# Patient Record
Sex: Female | Born: 1980 | Hispanic: Yes | Smoking: Never smoker
Health system: Southern US, Community
[De-identification: ages and names within clinical notes are randomized; demographics above are authoritative.]

## PROBLEM LIST (undated history)

## (undated) ENCOUNTER — Inpatient Hospital Stay (HOSPITAL_COMMUNITY): Payer: Self-pay

## (undated) DIAGNOSIS — N83201 Unspecified ovarian cyst, right side: Secondary | ICD-10-CM

## (undated) DIAGNOSIS — O139 Gestational [pregnancy-induced] hypertension without significant proteinuria, unspecified trimester: Secondary | ICD-10-CM

## (undated) DIAGNOSIS — N390 Urinary tract infection, site not specified: Secondary | ICD-10-CM

## (undated) DIAGNOSIS — N83202 Unspecified ovarian cyst, left side: Secondary | ICD-10-CM

## (undated) DIAGNOSIS — K259 Gastric ulcer, unspecified as acute or chronic, without hemorrhage or perforation: Secondary | ICD-10-CM

## (undated) HISTORY — DX: Unspecified ovarian cyst, left side: N83.202

## (undated) HISTORY — DX: Urinary tract infection, site not specified: N39.0

## (undated) HISTORY — DX: Unspecified ovarian cyst, right side: N83.201

## (undated) HISTORY — DX: Gastric ulcer, unspecified as acute or chronic, without hemorrhage or perforation: K25.9

## (undated) HISTORY — PX: NO PAST SURGERIES: SHX2092

---
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
## 2018-05-06 NOTE — L&D Delivery Note (Addendum)
Delivery Note At 6:24 PM a healthy female was delivered via Vaginal, Spontaneous Vertex delivery.  APGAR 8/9: weight pending Placenta status: intact, delivered spontaneously   Cord: 3-vessel. No complications  Anesthesia:  Epidural Episiotomy: None Lacerations: None Suture Repair: none needed Est. Blood Loss (mL):  150  Mom to postpartum.  Baby to Couplet care / Skin to Skin.  Nikki Dom Driver 09/05/7480, 7:07 PM  The above was performed under my direct supervision and guidance.

## 2018-07-06 ENCOUNTER — Telehealth: Payer: Self-pay | Admitting: Family Medicine

## 2018-07-06 ENCOUNTER — Other Ambulatory Visit (HOSPITAL_COMMUNITY): Payer: Self-pay | Admitting: Family

## 2018-07-06 DIAGNOSIS — Z369 Encounter for antenatal screening, unspecified: Secondary | ICD-10-CM

## 2018-07-06 LAB — TRICHOMONAS (~~LOC~~): Trichomonas: NEGATIVE

## 2018-07-06 LAB — OB RESULTS CONSOLE HIV ANTIBODY (ROUTINE TESTING): HIV: NONREACTIVE

## 2018-07-06 LAB — OB RESULTS CONSOLE PLATELET COUNT: Platelets: 198

## 2018-07-06 LAB — DRUG SCREEN, URINE: Drug Screen, Urine: NEGATIVE

## 2018-07-06 LAB — OB RESULTS CONSOLE HGB/HCT, BLOOD
HCT: 39 (ref 29–41)
Hemoglobin: 12.9

## 2018-07-06 LAB — OB RESULTS CONSOLE ABO/RH: RH Type: POSITIVE

## 2018-07-06 LAB — CULTURE, OB URINE: Urine Culture, OB: NO GROWTH

## 2018-07-06 LAB — OB RESULTS CONSOLE RPR: RPR: NONREACTIVE

## 2018-07-06 LAB — OB RESULTS CONSOLE RUBELLA ANTIBODY, IGM: Rubella: IMMUNE

## 2018-07-06 LAB — OB RESULTS CONSOLE GC/CHLAMYDIA
CHLAMYDIA, DNA PROBE: NEGATIVE
Gonorrhea: NEGATIVE

## 2018-07-06 LAB — OB RESULTS CONSOLE ANTIBODY SCREEN: ANTIBODY SCREEN: NEGATIVE

## 2018-07-06 LAB — HEPATITIS C ANTIBODY: Hepatitis C Ab: NEGATIVE

## 2018-07-06 LAB — CYTOLOGY - PAP: Pap: NEGATIVE

## 2018-07-06 LAB — OB RESULTS CONSOLE HEPATITIS B SURFACE ANTIGEN: Hepatitis B Surface Ag: NEGATIVE

## 2018-07-06 LAB — OB RESULTS CONSOLE VARICELLA ZOSTER ANTIBODY, IGG: Varicella: IMMUNE

## 2018-07-06 NOTE — Telephone Encounter (Signed)
The health department called to make an appointment for the patient as she is high risk.

## 2018-07-13 ENCOUNTER — Inpatient Hospital Stay (HOSPITAL_COMMUNITY)
Admission: AD | Admit: 2018-07-13 | Discharge: 2018-07-14 | Disposition: A | Discharge status: Home / Self Care | Payer: Medicaid Other | Attending: Obstetrics and Gynecology | Admitting: Obstetrics and Gynecology

## 2018-07-13 DIAGNOSIS — O219 Vomiting of pregnancy, unspecified: Secondary | ICD-10-CM | POA: Insufficient documentation

## 2018-07-13 DIAGNOSIS — R109 Unspecified abdominal pain: Secondary | ICD-10-CM

## 2018-07-13 DIAGNOSIS — O26891 Other specified pregnancy related conditions, first trimester: Secondary | ICD-10-CM

## 2018-07-13 DIAGNOSIS — Z3491 Encounter for supervision of normal pregnancy, unspecified, first trimester: Secondary | ICD-10-CM

## 2018-07-13 DIAGNOSIS — R1010 Upper abdominal pain, unspecified: Secondary | ICD-10-CM | POA: Insufficient documentation

## 2018-07-13 DIAGNOSIS — R12 Heartburn: Secondary | ICD-10-CM | POA: Insufficient documentation

## 2018-07-13 DIAGNOSIS — O99611 Diseases of the digestive system complicating pregnancy, first trimester: Secondary | ICD-10-CM | POA: Insufficient documentation

## 2018-07-13 DIAGNOSIS — Z79899 Other long term (current) drug therapy: Secondary | ICD-10-CM | POA: Insufficient documentation

## 2018-07-13 DIAGNOSIS — R1030 Lower abdominal pain, unspecified: Secondary | ICD-10-CM | POA: Insufficient documentation

## 2018-07-13 DIAGNOSIS — Z3A11 11 weeks gestation of pregnancy: Secondary | ICD-10-CM | POA: Insufficient documentation

## 2018-07-13 HISTORY — DX: Gestational (pregnancy-induced) hypertension without significant proteinuria, unspecified trimester: O13.9

## 2018-07-14 ENCOUNTER — Encounter (HOSPITAL_COMMUNITY): Payer: Self-pay

## 2018-07-14 ENCOUNTER — Inpatient Hospital Stay (HOSPITAL_COMMUNITY): Payer: Self-pay

## 2018-07-14 DIAGNOSIS — Z3A11 11 weeks gestation of pregnancy: Secondary | ICD-10-CM

## 2018-07-14 DIAGNOSIS — O26891 Other specified pregnancy related conditions, first trimester: Secondary | ICD-10-CM | POA: Diagnosis not present

## 2018-07-14 DIAGNOSIS — R109 Unspecified abdominal pain: Secondary | ICD-10-CM

## 2018-07-14 DIAGNOSIS — O219 Vomiting of pregnancy, unspecified: Secondary | ICD-10-CM

## 2018-07-14 LAB — WET PREP, GENITAL
Sperm: NONE SEEN
Trich, Wet Prep: NONE SEEN
Yeast Wet Prep HPF POC: NONE SEEN

## 2018-07-14 LAB — CBC
HCT: 35.3 % — ABNORMAL LOW (ref 36.0–46.0)
Hemoglobin: 11.9 g/dL — ABNORMAL LOW (ref 12.0–15.0)
MCH: 29 pg (ref 26.0–34.0)
MCHC: 33.7 g/dL (ref 30.0–36.0)
MCV: 86.1 fL (ref 80.0–100.0)
Platelets: 187 10*3/uL (ref 150–400)
RBC: 4.1 MIL/uL (ref 3.87–5.11)
RDW: 12 % (ref 11.5–15.5)
WBC: 7.3 10*3/uL (ref 4.0–10.5)
nRBC: 0 % (ref 0.0–0.2)

## 2018-07-14 LAB — GC/CHLAMYDIA PROBE AMP (~~LOC~~) NOT AT ARMC
Chlamydia: NEGATIVE
Neisseria Gonorrhea: NEGATIVE

## 2018-07-14 LAB — ABO/RH: ABO/RH(D): O POS

## 2018-07-14 LAB — HCG, QUANTITATIVE, PREGNANCY: hCG, Beta Chain, Quant, S: 126854 m[IU]/mL — ABNORMAL HIGH (ref <5)

## 2018-07-14 MED ORDER — M.V.I. ADULT IV INJ
Freq: Once | INTRAVENOUS | Status: AC
  Administered 2018-07-14: 02:00:00 via INTRAVENOUS
  Filled 2018-07-14: qty 10

## 2018-07-14 MED ORDER — SODIUM CHLORIDE 0.9 % IV SOLN
8.0000 mg | Freq: Once | INTRAVENOUS | Status: AC
  Administered 2018-07-14: 8 mg via INTRAVENOUS
  Filled 2018-07-14: qty 4

## 2018-07-14 MED ORDER — FAMOTIDINE 20 MG PO TABS: 20.0000 mg | ORAL_TABLET | Freq: Two times a day (BID) | ORAL | 0 refills | Status: DC

## 2018-07-14 MED ORDER — ONDANSETRON 4 MG PO TBDP: 4.0000 mg | ORAL_TABLET | Freq: Three times a day (TID) | ORAL | 0 refills | Status: DC | PRN

## 2018-07-14 MED ORDER — LACTATED RINGERS IV BOLUS
1000.0000 mL | Freq: Once | INTRAVENOUS | Status: AC
  Administered 2018-07-14: 1000 mL via INTRAVENOUS

## 2018-07-14 MED ORDER — FAMOTIDINE IN NACL 20-0.9 MG/50ML-% IV SOLN
20.0000 mg | Freq: Once | INTRAVENOUS | Status: AC
  Administered 2018-07-14: 20 mg via INTRAVENOUS
  Filled 2018-07-14: qty 50

## 2018-07-14 NOTE — MAU Provider Note (Signed)
History     CSN: [id number]  Arrival date and time: 07/13/18 2342   First Provider Initiated Contact with Patient 07/13/18 1155      Chief Complaint  Patient presents with  . Abdominal Pain  . Emesis   Marie Bridges is a 38 y.o. G4P3 at [redacted]w[redacted]d who presents to MAU with complaints of abdominal pain and N/V. She reports N/V has been occurring for the past 3 days, reports being unable to keep anything down and vomiting 13+ times over the past 24 hours. She reports being unable to eat over the last 3 days. Reports abdominal pain started occurring 2 days ago. Reports upper and lower abdominal pain, describes upper abdominal pain as aching due to not being able to eat, lower abdominal pain as intermittent cramping with sharp pain. Rates pain 5/10- has not taken any medication for abdominal pain. Denies vaginal bleeding or vaginal discharge.   OB History    Gravida  4   Para  3   Term  3   Preterm  0   AB  0   Living  3     SAB  0   TAB  0   Ectopic  0   Multiple  0   Live Births  3           Past Medical History:  Diagnosis Date  . Pregnancy induced hypertension    First pregnancy  . Preterm labor     Past Surgical History:  Procedure Laterality Date  . NO PAST SURGERIES      No family history on file.  Social History   Tobacco Use  . Smoking status: Not on file  Substance Use Topics  . Alcohol use: Not on file  . Drug use: Not on file    Allergies: No Known Allergies  Medications Prior to Admission  Medication Sig Dispense Refill Last Dose  . metroNIDAZOLE (FLAGYL) 500 MG tablet Take 500 mg by mouth 3 (three) times daily.       Review of Systems  Constitutional: Negative.   Respiratory: Negative.   Cardiovascular: Negative.   Gastrointestinal: Positive for abdominal pain, nausea and vomiting. Negative for constipation and diarrhea.  Genitourinary: Negative.   Musculoskeletal: Negative.    Physical Exam   Blood pressure 116/65, pulse  95, temperature 98.9 F (37.2 C), resp. rate 16, weight 55.6 kg, last menstrual period 04/24/2018, SpO2 100 %.  Physical Exam  Nursing note and vitals reviewed. Constitutional: She is oriented to person, place, and time. She appears well-developed and well-nourished. No distress.  Cardiovascular: Normal rate, regular rhythm and normal heart sounds.  Respiratory: Effort normal and breath sounds normal. No respiratory distress. She has no wheezes. She has no rales.  GI: Soft. There is abdominal tenderness. There is no rebound and no guarding.  Tenderness in upper and lower abdomen  Neurological: She is alert and oriented to person, place, and time.  Psychiatric: She has a normal mood and affect. Her behavior is normal. Thought content normal.   FHR 165 by doppler   MAU Course  Procedures  MDM Orders Placed This Encounter  Procedures  . Wet prep, genital  . US OB Comp Less 14 Wks  . Urinalysis, Routine w reflex microscopic  . hCG, quantitative, pregnancy  . CBC  . ABO/Rh  . Insert peripheral IV   Labs and [place] report reviewed:  Results for orders placed or performed during the hospital encounter of 07/13/18 (from the past 24 hour(s))  hCG,  quantitative, pregnancy     Status: Abnormal   Collection Time: 07/14/18 12:30 AM  Result Value Ref Range   hCG, Beta Nyra Jabs, S [phone] (H) <5 mIU/mL  CBC     Status: Abnormal   Collection Time: 07/14/18 12:42 AM  Result Value Ref Range   WBC 7.3 4.0 - 10.5 K/uL   RBC 4.10 3.87 - 5.11 MIL/uL   Hemoglobin 11.9 (L) 12.0 - 15.0 g/dL   HCT 04.5 (L) 40.9 - 81.1 %   MCV 86.1 80.0 - 100.0 fL   MCH 29.0 26.0 - 34.0 pg   MCHC 33.7 30.0 - 36.0 g/dL   RDW 91.4 78.2 - 95.6 %   Platelets 187 150 - 400 K/uL   nRBC 0.0 0.0 - 0.2 %  Wet prep, genital     Status: Abnormal   Collection Time: 07/14/18 12:51 AM  Result Value Ref Range   Yeast Wet Prep HPF POC NONE SEEN NONE SEEN   Trich, Wet Prep NONE SEEN NONE SEEN   Clue Cells Wet Prep HPF POC  PRESENT (A) NONE SEEN   WBC, Wet Prep HPF POC MANY (A) NONE SEEN   Sperm NONE SEEN   ABO/Rh     Status: None   Collection Time: 07/14/18  1:03 AM  Result Value Ref Range   ABO/RH(D) O POS    No rh immune globuloin      NOT A RH IMMUNE GLOBULIN CANDIDATE, PT RH POSITIVE Performed at [facility], [address] [place], [place] [address]    US Ob Comp Less 14 Wks  Result Date: 07/14/2018 CLINICAL DATA:  Abdominal pain EXAM: OBSTETRIC <14 WK ULTRASOUND TECHNIQUE: Transabdominal ultrasound was performed for evaluation of the gestation as well as the maternal uterus and adnexal regions. COMPARISON:  None. FINDINGS: Intrauterine gestational sac: Single Yolk sac:  Not seen Embryo:  Visualized. Cardiac Activity: Visualized. Heart Rate: 153 bpm CRL: 55.7 mm   12 w 1 d                  [place] EDC: 01/25/2019 Subchorionic hemorrhage:  None visualized. Maternal uterus/adnexae: right ovary measures 2.5 x 1.4 x 1.9 cm. Left ovary measures 2.8 x 2 x 2.1 cm. No significant free fluid. IMPRESSION: Single viable intrauterine pregnancy as above. No specific abnormality is seen Electronically Signed   By: Jasmine Pang M.D.   On: 07/14/2018 02:42   Treatments in MAU included LR bolus, IV pepcid, zofran and banana bag for N/V. Patient able to tolerate PO challenge prior to discharge home, reports pain is resolved and heartburn is relieved. Request medication for heartburn/acid reflux for home use.   Follow up as scheduled for prenatal appointments, return to MAU as needed. Rx for zofran and pepcid sent to pharmacy of choice. Pt stable at time of discharge.   Assessment and Plan   1. Normal IUP (intrauterine pregnancy) on prenatal ultrasound, first trimester   2. Abdominal pain during pregnancy in first trimester   3. Nausea and vomiting during pregnancy   4. [redacted] weeks gestation of pregnancy    Discharge home Follow up as scheduled  Return to MAU as needed Rx for zofran and pepcid for home use    Follow-up Information    [facility].   Specialty:  Obstetrics and Gynecology Why:  Follow up as scheduled for prenatal appointment  Contact information: [address] [place] [address] [phone]  Allergies as of 07/14/2018   No Known Allergies     Medication List    STOP taking these medications   metroNIDAZOLE 500 MG tablet Commonly known as:  FLAGYL     TAKE these medications   famotidine 20 MG tablet Commonly known as:  PEPCID Take 1 tablet (20 mg total) by mouth 2 (two) times daily.   ondansetron 4 MG disintegrating tablet Commonly known as:  Zofran ODT Take 1 tablet (4 mg total) by mouth every 8 (eight) hours as needed for nausea or vomiting.      Sharyon Cable CNM 07/14/2018, 2:56 AM

## 2018-07-14 NOTE — MAU Note (Signed)
Pt here with complaints of nausea, HA, chills x3 days. Pt reports vomiting up to 13 times/day. Reports some small amount of blood in her vomit. Has not eaten in 3 days. Reports she is not able to keep anything down. Reports upper and lower abdominal pain. Denies vaginal bleeding. Reports she is 11 weeks.

## 2018-07-16 ENCOUNTER — Encounter: Payer: Self-pay | Admitting: *Deleted

## 2018-07-20 ENCOUNTER — Encounter: Payer: Self-pay | Admitting: Obstetrics and Gynecology

## 2018-07-20 ENCOUNTER — Encounter (HOSPITAL_COMMUNITY): Payer: Self-pay

## 2018-07-20 DIAGNOSIS — O09529 Supervision of elderly multigravida, unspecified trimester: Secondary | ICD-10-CM | POA: Insufficient documentation

## 2018-07-24 ENCOUNTER — Encounter: Payer: Self-pay | Admitting: *Deleted

## 2018-07-24 ENCOUNTER — Encounter (HOSPITAL_COMMUNITY): Payer: Self-pay | Admitting: *Deleted

## 2018-07-27 ENCOUNTER — Other Ambulatory Visit (HOSPITAL_COMMUNITY): Payer: Self-pay

## 2018-07-27 ENCOUNTER — Encounter (HOSPITAL_COMMUNITY): Payer: Self-pay

## 2018-07-27 ENCOUNTER — Ambulatory Visit (HOSPITAL_COMMUNITY): Payer: Self-pay

## 2018-07-29 ENCOUNTER — Encounter (HOSPITAL_COMMUNITY): Payer: Self-pay

## 2018-08-05 ENCOUNTER — Ambulatory Visit (INDEPENDENT_AMBULATORY_CARE_PROVIDER_SITE_OTHER): Payer: Self-pay | Admitting: Obstetrics & Gynecology

## 2018-08-05 ENCOUNTER — Encounter: Payer: Self-pay | Admitting: Obstetrics & Gynecology

## 2018-08-05 ENCOUNTER — Encounter: Payer: Self-pay | Admitting: *Deleted

## 2018-08-05 ENCOUNTER — Other Ambulatory Visit: Payer: Self-pay

## 2018-08-05 VITALS — BP 117/68 | HR 87 | Wt 124.5 lb

## 2018-08-05 DIAGNOSIS — O26892 Other specified pregnancy related conditions, second trimester: Secondary | ICD-10-CM

## 2018-08-05 DIAGNOSIS — O09522 Supervision of elderly multigravida, second trimester: Secondary | ICD-10-CM

## 2018-08-05 DIAGNOSIS — O09292 Supervision of pregnancy with other poor reproductive or obstetric history, second trimester: Secondary | ICD-10-CM

## 2018-08-05 DIAGNOSIS — Z3480 Encounter for supervision of other normal pregnancy, unspecified trimester: Secondary | ICD-10-CM | POA: Insufficient documentation

## 2018-08-05 DIAGNOSIS — R300 Dysuria: Secondary | ICD-10-CM

## 2018-08-05 DIAGNOSIS — Z789 Other specified health status: Secondary | ICD-10-CM | POA: Insufficient documentation

## 2018-08-05 DIAGNOSIS — Z3A14 14 weeks gestation of pregnancy: Secondary | ICD-10-CM

## 2018-08-05 LAB — POCT URINALYSIS DIP (DEVICE)
Bilirubin Urine: NEGATIVE
Glucose, UA: NEGATIVE mg/dL
Hgb urine dipstick: NEGATIVE
Ketones, ur: NEGATIVE mg/dL
Nitrite: NEGATIVE
Protein, ur: NEGATIVE mg/dL
Specific Gravity, Urine: 1.025 (ref 1.005–1.030)
Urobilinogen, UA: 1 mg/dL (ref 0.0–1.0)
pH: 7 (ref 5.0–8.0)

## 2018-08-05 MED ORDER — OMEPRAZOLE 20 MG PO CPDR: 20.0000 mg | DELAYED_RELEASE_CAPSULE | Freq: Every day | ORAL | 9 refills | Status: DC

## 2018-08-05 MED ORDER — ASPIRIN 81 MG PO CHEW: 81.0000 mg | CHEWABLE_TABLET | Freq: Every day | ORAL | 9 refills | Status: DC

## 2018-08-05 NOTE — Progress Notes (Signed)
  Subjective:    Marie Bridges is being seen today for her first obstetrical visit.  This is a planned pregnancy. She is at [redacted]w[redacted]d gestation. Her obstetrical history is significant for advanced maternal age and pre-eclampsia. Relationship with FOB: spouse, living together. Patient does intend to breast feed. Pregnancy history fully reviewed.  Patient reports no complaints.  Review of Systems:   Review of Systems  Objective:     BP 117/68   Pulse 87   Wt 124 lb 8 oz (56.5 kg)   LMP 04/24/2018  Physical Exam  Exam Breathing, conversing, and ambulating normally Well nourished, well hydrated Latina, no apparent distress  Live interpretor present  Heart- rrr Lungs- CTAB Abd- benign FHR- 154  Assessment:    Pregnancy: [id number] Patient Active Problem List   Diagnosis Date Noted  . Supervision of other normal pregnancy, antepartum 08/05/2018  . AMA (advanced maternal age) multigravida 35+ 07/20/2018       Plan:     Initial labs drawn. Prenatal vitamins. Problem list reviewed and updated. AMA- she has a First screen scheduled but she does not want this. She wants NIPS today. Role of ultrasound in pregnancy discussed; fetal survey: ordered. Rec baby asa daily due to h/o pre eclampsia in her first pregnancy Check pr/creatinine and complete meta panel Panorma/Horizen today She was given a BP cuff but does not have an email or smart phone. Virtual visit at 20 weeks and in person at 28 weeks.  Allie Bossier 08/05/2018

## 2018-08-05 NOTE — Progress Notes (Signed)
Patient given BP cuff ([facility] issued) and instructed in use/how to check her BP. Patient is unable to use BabyScripts App.

## 2018-08-06 LAB — BASIC METABOLIC PANEL
BUN/Creatinine Ratio: 16 (ref 9–23)
BUN: 7 mg/dL (ref 6–20)
CO2: 21 mmol/L (ref 20–29)
Calcium: 9 mg/dL (ref 8.7–10.2)
Chloride: 103 mmol/L (ref 96–106)
Creatinine, Ser: 0.43 mg/dL — ABNORMAL LOW (ref 0.57–1.00)
GFR calc Af Amer: 152 mL/min/{1.73_m2} (ref >59)
GFR calc non Af Amer: 132 mL/min/{1.73_m2} (ref >59)
Glucose: 72 mg/dL (ref 65–99)
Potassium: 4.2 mmol/L (ref 3.5–5.2)
Sodium: 139 mmol/L (ref 134–144)

## 2018-08-06 LAB — PROTEIN / CREATININE RATIO, URINE
Creatinine, Urine: 131 mg/dL
Protein, Ur: 43 mg/dL
Protein/Creat Ratio: 328 mg/g creat — ABNORMAL HIGH (ref 0–200)

## 2018-08-06 LAB — HEMOGLOBIN A1C
Est. average glucose Bld gHb Est-mCnc: 100 mg/dL
Hgb A1c MFr Bld: 5.1 % (ref 4.8–5.6)

## 2018-08-07 LAB — CULTURE, OB URINE

## 2018-08-07 LAB — URINE CULTURE, OB REFLEX

## 2018-08-12 ENCOUNTER — Encounter (HOSPITAL_COMMUNITY): Payer: Self-pay

## 2018-08-24 ENCOUNTER — Telehealth: Payer: Self-pay | Admitting: Family Medicine

## 2018-08-24 NOTE — Telephone Encounter (Signed)
Called patient with Spanish interpret Mariel to inform her of her follow-up appointment which will be done over the telephone. Patient verbalized understanding.

## 2018-09-17 ENCOUNTER — Ambulatory Visit (INDEPENDENT_AMBULATORY_CARE_PROVIDER_SITE_OTHER): Payer: Self-pay | Admitting: Obstetrics & Gynecology

## 2018-09-17 ENCOUNTER — Other Ambulatory Visit: Payer: Self-pay

## 2018-09-17 VITALS — BP 138/90

## 2018-09-17 DIAGNOSIS — R3915 Urgency of urination: Secondary | ICD-10-CM

## 2018-09-17 DIAGNOSIS — O09522 Supervision of elderly multigravida, second trimester: Secondary | ICD-10-CM

## 2018-09-17 DIAGNOSIS — Z789 Other specified health status: Secondary | ICD-10-CM

## 2018-09-17 DIAGNOSIS — O09292 Supervision of pregnancy with other poor reproductive or obstetric history, second trimester: Secondary | ICD-10-CM

## 2018-09-17 DIAGNOSIS — Z3480 Encounter for supervision of other normal pregnancy, unspecified trimester: Secondary | ICD-10-CM

## 2018-09-17 DIAGNOSIS — Z3A20 20 weeks gestation of pregnancy: Secondary | ICD-10-CM

## 2018-09-17 NOTE — Progress Notes (Signed)
I connected with  Samiksha Belteton-Luis on 09/17/18 at  8:15 AM EDT by telephone and verified that I am speaking with the correct person using two identifiers.   I discussed the limitations, risks, security and privacy concerns of performing an evaluation and management service by telephone and the availability of in person appointments. I also discussed with the patient that there may be a patient responsible charge related to this service. The patient expressed understanding and agreed to proceed.  Janene Madeira Azarion Hove, CMA 09/17/2018  8:15 AM        Interpreter ID #[id number]  Pt states that she has been having contractions and her stomach has been feeling hard. This has been about two weeks now and her Curlene Labrum is extremely hard. Pt states that she needs to be seen because she is cramping really badly and she also stated that she has not has a ultrasound and never received a call about an ultrasound. Patient doesn't understand how to navigate or use the web ex app on her phone, states someone in the office downloaded it for her but is confused on how to navigate or use it. Had patient take her blood pressure over the phone she states that it is 138/90.  U/S Scheduled for Tuesday 5/19 @1245pm 

## 2018-09-17 NOTE — Progress Notes (Signed)
   TELEHEALTH VIRTUAL OBSTETRICS PRENATAL VISIT ENCOUNTER NOTE  I connected with Marie Bridges on 09/17/18 at  8:15 AM EDT by telephone at home and verified that I am speaking with the correct person using two identifiers.   I discussed the limitations, risks, security and privacy concerns of performing an evaluation and management service by telephone and the availability of in person appointments. I also discussed with the patient that there may be a patient responsible charge related to this service. The patient expressed understanding and agreed to proceed. Subjective:  Marie Bridges is a 38 y.o. [phone] at [redacted]w[redacted]d being seen today for ongoing prenatal care.  She is currently monitored for the following issues for this high-risk pregnancy and has AMA (advanced maternal age) multigravida 35+; Supervision of other normal pregnancy, antepartum; and Language barrier on their problem list.  Patient reports cramping for 2 weeks, no bleeding. She also urinary urgency for about 1 week..  Reports fetal movement.  .  .   . Denies any contractions, bleeding or leaking of fluid.   The following portions of the patient's history were reviewed and updated as appropriate: allergies, current medications, past family history, past medical history, past social history, past surgical history and problem list.   Objective:  There were no vitals filed for this visit.  Fetal Status:           General:  Alert, oriented and cooperative. Patient is in no acute distress.  Respiratory: Normal respiratory effort, no problems with respiration noted  Mental Status: Normal mood and affect. Normal behavior. Normal judgment and thought content.  Rest of physical exam deferred due to type of encounter  Assessment and Plan:  Pregnancy: [id number] at [redacted]w[redacted]d 1. Language barrier - [place] interpretor used for visit  2. Multigravida of advanced maternal age in second trimester - anatomy u/s scheduled. It had been  ordered at her first visit but never scheduled.   3. Supervision of other normal pregnancy, antepartum   4. History of pre-eclampsia in prior pregnancy, currently pregnant in second trimester - baby asa daily  Preterm labor symptoms and general obstetric precautions including but not limited to vaginal bleeding, contractions, leaking of fluid and fetal movement were reviewed in detail with the patient. I discussed the assessment and treatment plan with the patient. The patient was provided an opportunity to ask questions and all were answered. The patient agreed with the plan and demonstrated an understanding of the instructions. The patient was advised to call back or seek an in-person office evaluation/go to MAU at [facility] for any urgent or concerning symptoms. Please refer to After Visit Summary for other counseling recommendations.   I provided 10 minutes of face-to-face via WebEx time during this encounter.  No follow-ups on file.  No future appointments.  Allie Bossier, MD [facility] [employer], [facility]

## 2018-09-18 ENCOUNTER — Ambulatory Visit (INDEPENDENT_AMBULATORY_CARE_PROVIDER_SITE_OTHER): Payer: Self-pay

## 2018-09-18 ENCOUNTER — Other Ambulatory Visit: Payer: Self-pay

## 2018-09-18 VITALS — BP 99/66 | HR 89 | Wt 126.4 lb

## 2018-09-18 DIAGNOSIS — Z0130 Encounter for examination of blood pressure without abnormal findings: Secondary | ICD-10-CM

## 2018-09-18 NOTE — Progress Notes (Signed)
Agree with A & P. 

## 2018-09-18 NOTE — Progress Notes (Signed)
Pt here today for BP check, weight check, and urine culture.  Pt BP 99/66 LA, weight is 126.4lb , and OB urine culture obtained today. With [employer]. Pt denies any new sx's today.  Notified Dr. Marice Potter pt's BP and weight.  No new orders.  Pt reports that she went walking and then tried to come home and cook and she was so fatigued.  I explained to the pt that it is normal in pregnancy it is her body telling her to rest. Pt stated that when she did rest the discomfort went away.  Pt did not have any further questions.

## 2018-09-21 LAB — URINE CULTURE, OB REFLEX

## 2018-09-21 LAB — CULTURE, OB URINE

## 2018-09-22 ENCOUNTER — Other Ambulatory Visit: Payer: Self-pay | Admitting: Obstetrics & Gynecology

## 2018-09-22 ENCOUNTER — Other Ambulatory Visit: Payer: Self-pay

## 2018-09-22 ENCOUNTER — Ambulatory Visit (HOSPITAL_COMMUNITY)
Admission: RE | Admit: 2018-09-22 | Discharge: 2018-09-22 | Disposition: A | Discharge status: Home / Self Care | Payer: Self-pay | Source: Ambulatory Visit | Attending: Obstetrics and Gynecology | Admitting: Obstetrics and Gynecology

## 2018-09-22 ENCOUNTER — Other Ambulatory Visit (HOSPITAL_COMMUNITY): Payer: Self-pay | Admitting: *Deleted

## 2018-09-22 DIAGNOSIS — O09292 Supervision of pregnancy with other poor reproductive or obstetric history, second trimester: Secondary | ICD-10-CM

## 2018-09-22 DIAGNOSIS — O09212 Supervision of pregnancy with history of pre-term labor, second trimester: Secondary | ICD-10-CM

## 2018-09-22 DIAGNOSIS — Z363 Encounter for antenatal screening for malformations: Secondary | ICD-10-CM

## 2018-09-22 DIAGNOSIS — Z3A21 21 weeks gestation of pregnancy: Secondary | ICD-10-CM

## 2018-09-22 DIAGNOSIS — O09522 Supervision of elderly multigravida, second trimester: Secondary | ICD-10-CM

## 2018-09-22 DIAGNOSIS — Z3480 Encounter for supervision of other normal pregnancy, unspecified trimester: Secondary | ICD-10-CM

## 2018-09-22 DIAGNOSIS — O09529 Supervision of elderly multigravida, unspecified trimester: Secondary | ICD-10-CM

## 2018-10-13 ENCOUNTER — Telehealth: Payer: Self-pay | Admitting: Family Medicine

## 2018-10-13 NOTE — Telephone Encounter (Signed)
Patient called and informed about appt with [name].

## 2018-10-14 ENCOUNTER — Telehealth: Payer: Self-pay | Admitting: Obstetrics & Gynecology

## 2018-10-14 NOTE — Telephone Encounter (Signed)
Called patient with [name]’s help. She already has WebEx app. °

## 2018-10-15 ENCOUNTER — Ambulatory Visit (INDEPENDENT_AMBULATORY_CARE_PROVIDER_SITE_OTHER): Payer: Self-pay | Admitting: Obstetrics & Gynecology

## 2018-10-15 ENCOUNTER — Other Ambulatory Visit: Payer: Self-pay

## 2018-10-15 VITALS — BP 90/67 | HR 85

## 2018-10-15 DIAGNOSIS — Z789 Other specified health status: Secondary | ICD-10-CM

## 2018-10-15 DIAGNOSIS — O09522 Supervision of elderly multigravida, second trimester: Secondary | ICD-10-CM

## 2018-10-15 DIAGNOSIS — Z3A24 24 weeks gestation of pregnancy: Secondary | ICD-10-CM

## 2018-10-15 DIAGNOSIS — Z3480 Encounter for supervision of other normal pregnancy, unspecified trimester: Secondary | ICD-10-CM

## 2018-10-15 NOTE — Progress Notes (Signed)
I connected with  Marie Bridges on 10/15/18 at  9:35 AM EDT by telephone and verified that I am speaking with the correct person using two identifiers.   I discussed the limitations, risks, security and privacy concerns of performing an evaluation and management service by telephone and the availability of in person appointments. I also discussed with the patient that there may be a patient responsible charge related to this service. The patient expressed understanding and agreed to proceed.  [name], RN 10/15/2018  9:28 AM   Pt has BP cuff but no email and therefore is unable to do babyscripts. Pt instructed to check a weekly blood pressure, write them down, and call the clinic if any are more than 140/90.  Pt verbalized understanding. Raquel used for interpretation.

## 2018-10-15 NOTE — Progress Notes (Signed)
TELEHEALTH OBSTETRICS PRENATAL VIRTUAL VIDEO VISIT ENCOUNTER NOTE  [facility]   I connected with Marie Bridges on 10/15/18 at  9:35 AM EDT by WebEx Encounter at home and verified that I am speaking with the correct person using two identifiers.   I discussed the limitations, risks, security and privacy concerns of performing an evaluation and management service by telephone and the availability of in person appointments. I also discussed with the patient that there may be a patient responsible charge related to this service. The patient expressed understanding and agreed to proceed. Subjective:  Marie Bridges is a 38 y.o. [phone] (at 5385w6d being seen today for ongoing prenatal care.  She is currently monitored for the following issues for this high-risk pregnancy and has AMA (advanced maternal age) multigravida 35+; Supervision of other normal pregnancy, antepartum; and Language barrier on their problem list.  Patient reports no complaints.  Contractions: Irregular. Vag. Bleeding: None.  Movement: Present. Denies any leaking of fluid.   The following portions of the patient's history were reviewed and updated as appropriate: allergies, current medications, past family history, past medical history, past social history, past surgical history and problem list.   Objective:   Vitals:   10/15/18 0933  BP: 90/67  Pulse: 85    Fetal Status:     Movement: Present     General:  Alert, oriented and cooperative. Patient is in no acute distress.  Respiratory: Normal respiratory effort, no problems with respiration noted  Mental Status: Normal mood and affect. Normal behavior. Normal judgment and thought content.  Rest of physical exam deferred due to type of encounter  Imaging: Koreas Mfm Ob Detail +14 Wk  Result Date: 09/22/2018 ----------------------------------------------------------------------  OBSTETRICS REPORT                        (Signed Final 09/22/2018 02:22 pm) ---------------------------------------------------------------------- Patient Info  ID #:       [id number]                          D.O.B.:  01-11-1981 (37 yrs)  Name:       Marie Bridges              Visit Date: 09/22/2018 12:40 pm ---------------------------------------------------------------------- Performed By  Performed By:     Hurman HornAlyssa Keatts          Ref. Address:     [address]                    [address]                                                             [address]                                                             [place], [place]  [id number]  Attending:        Noralee Spaceavi Shankar MD        [facility]  Referred By:      Allie BossierMYRA C Paulita Licklider MD ---------------------------------------------------------------------- Orders   #  Description                          Code         Ordered By   1  US MFM OB DETAIL +14 WK              76811.01     Calianna Kim  ----------------------------------------------------------------------   #  Order #                    Accession #                 Episode #   1  [id number]                  [id number]                  [id number]  ---------------------------------------------------------------------- Indications   Encounter for antenatal screening for          Z36.3   malformations   Advanced maternal age multigravida 4635+,        70O09.522   second trimester (neg horizon)   Poor obstetric history: Previous               O09.299   preeclampsia / eclampsia/gestational HTN   (ASA)   Poor obstetric history: Previous preterm       O09.219   delivery, antepartum (34-due to pre-e)   [redacted] weeks gestation of pregnancy                Z3A.21  ---------------------------------------------------------------------- Fetal Evaluation  Num Of Fetuses:         1  Fetal Heart Rate(bpm):  145  Cardiac Activity:        Observed  Presentation:           Variable  Placenta:               Posterior  P. Cord Insertion:      Visualized  Amniotic Fluid  AFI FV:      Within normal limits                              Largest Pocket(cm)                              4.5 ---------------------------------------------------------------------- Biometry  BPD:      53.8  mm     G. Age:  22w 2d         78  %    CI:        74.46   %    70 - 86  FL/HC:      17.0   %    18.4 - 20.2  HC:      197.9  mm     G. Age:  22w 0d         57  %    HC/AC:      1.14        1.06 - 1.25  AC:      174.2  mm     G. Age:  22w 2d         68  %    FL/BPD:     62.6   %    71 - 87  FL:       33.7  mm     G. Age:  20w 4d         13  %    FL/AC:      19.3   %    20 - 24  HUM:      31.2  mm     G. Age:  20w 3d         17  %  CER:        23  mm     G. Age:  21w 4d         47  %  NFT:       6.9  mm  LV:        3.9  mm  CM:        3.4  mm  Est. FW:     438  gm    0 lb 15 oz      46  % ---------------------------------------------------------------------- OB History  Gravidity:    4         Term:   2        Prem:   1 ---------------------------------------------------------------------- Gestational Age  LMP:           21w 4d        Date:  04/24/18                 EDD:   01/29/19  U/S Today:     21w 6d                                        EDD:   01/27/19  Best:          [place] 4d     Det. By:  LMP  (04/24/18)          EDD:   01/29/19 ---------------------------------------------------------------------- Anatomy  Cranium:               Appears normal         Aortic Arch:            Appears normal  Cavum:                 Appears normal         Ductal Arch:            Appears normal  Ventricles:            Appears normal         Diaphragm:              Appears normal  Choroid Plexus:        Appears normal         Stomach:  Appears normal, left                                                                         sided  Cerebellum:            Appears normal         Abdomen:                Appears normal  Posterior Fossa:       Appears normal         Abdominal Wall:         Appears nml (cord                                                                        insert, abd wall)  Nuchal Fold:           Not applicable (>86    Cord Vessels:           Appears normal ([redacted]                         wks GA)                                        vessel cord)  Face:                  Appears normal         Kidneys:                Appear normal                         (orbits and profile)  Lips:                  Appears normal         Bladder:                Appears normal  Thoracic:              Appears normal         Spine:                  Appears normal  Heart:                 Appears normal         Upper Extremities:      Appears normal                         (4CH, axis, and                         situs)  RVOT:                  Appears normal  Lower Extremities:      Appears normal  LVOT:                  Appears normal  Other:  Fetus appears to be a female. Heels and 5th digit visualized. Open          hands visualized. Nasal bone visualized. Feet visualized. ---------------------------------------------------------------------- Cervix Uterus Adnexa  Cervix  Length:           4.28  cm.  Normal appearance by transabdominal scan.  Uterus  No abnormality visualized.  Left Ovary  Not visualized.  Right Ovary  Not visualized.  Adnexa  No abnormality visualized. ---------------------------------------------------------------------- Impression  We performed fetal anatomy scan. No makers of  aneuploidies or fetal structural defects are seen. Fetal  biometry is consistent with her previously-established dates.  Amniotic fluid is normal and good fetal activity is seen.  Cell-free fetal DNA screening results are pending. ---------------------------------------------------------------------- Recommendations  An appointmen was made for  her to return at 32 weeks'  gestation for fetal growth assessment (AMA). ----------------------------------------------------------------------                  Noralee Spaceavi Shankar, MD Electronically Signed Final Report   09/22/2018 02:22 pm ----------------------------------------------------------------------   Assessment and Plan:  Pregnancy: [id number] at 3045w6d 1. Multigravida of advanced maternal age in second trimester - We discussed NIPS which didn't appear to have been drawn. She declines this test.  2. Supervision of other normal pregnancy, antepartum - 2 hour GTT at next visit - follow up MFM u/s scheduled  3. Language barrier -Live interpretor present   Preterm labor symptoms and general obstetric precautions including but not limited to vaginal bleeding, contractions, leaking of fluid and fetal movement were reviewed in detail with the patient. I discussed the assessment and treatment plan with the patient. The patient was provided an opportunity to ask questions and all were answered. The patient agreed with the plan and demonstrated an understanding of the instructions. The patient was advised to call back or seek an in-person office evaluation/[facility] for any urgent or concerning symptoms. Please refer to After Visit Summary for other counseling recommendations.   I provided 10 minutes of face-to-face time during this encounter.  No follow-ups on file.  Future Appointments  Date Time Provider Department Center  11/24/2018  1:15 PM [facility]-M[facility] US 4 [facility]-MFCUS MFC-US    Allie BossierMyra C Joven Mom, MD [facility], [facility]

## 2018-11-09 ENCOUNTER — Other Ambulatory Visit: Payer: Self-pay | Admitting: *Deleted

## 2018-11-09 DIAGNOSIS — Z3480 Encounter for supervision of other normal pregnancy, unspecified trimester: Secondary | ICD-10-CM

## 2018-11-12 ENCOUNTER — Telehealth: Payer: Self-pay | Admitting: Obstetrics and Gynecology

## 2018-11-12 NOTE — Telephone Encounter (Signed)
[name] spoke with patient about her appointment on 7/10 @ 8:55. Per [name] patient was instructed to wear a face mask for the entire appointment and no visitors are allowed during the appointment. Patient was screened for covid symptoms and denied having any.

## 2018-11-13 ENCOUNTER — Ambulatory Visit (INDEPENDENT_AMBULATORY_CARE_PROVIDER_SITE_OTHER): Payer: Self-pay | Admitting: Obstetrics and Gynecology

## 2018-11-13 ENCOUNTER — Other Ambulatory Visit: Payer: Self-pay

## 2018-11-13 VITALS — BP 100/66 | HR 85 | Wt 128.0 lb

## 2018-11-13 DIAGNOSIS — R300 Dysuria: Secondary | ICD-10-CM

## 2018-11-13 DIAGNOSIS — Z3A29 29 weeks gestation of pregnancy: Secondary | ICD-10-CM

## 2018-11-13 DIAGNOSIS — O26893 Other specified pregnancy related conditions, third trimester: Secondary | ICD-10-CM

## 2018-11-13 DIAGNOSIS — Z3480 Encounter for supervision of other normal pregnancy, unspecified trimester: Secondary | ICD-10-CM

## 2018-11-13 DIAGNOSIS — Z789 Other specified health status: Secondary | ICD-10-CM

## 2018-11-13 DIAGNOSIS — O09523 Supervision of elderly multigravida, third trimester: Secondary | ICD-10-CM

## 2018-11-13 LAB — POCT URINALYSIS DIP (DEVICE)
Bilirubin Urine: NEGATIVE
Glucose, UA: NEGATIVE mg/dL
Ketones, ur: NEGATIVE mg/dL
Leukocytes,Ua: NEGATIVE
Nitrite: NEGATIVE
Protein, ur: NEGATIVE mg/dL
Specific Gravity, Urine: 1.025 (ref 1.005–1.030)
Urobilinogen, UA: 0.2 mg/dL (ref 0.0–1.0)
pH: 7 (ref 5.0–8.0)

## 2018-11-13 NOTE — Progress Notes (Signed)
Prenatal Visit Note Date: 11/13/2018 [facility]  Subjective:  Marie Bridges is a 38 y.o. [phone] at [redacted]w[redacted]d being seen today for ongoing prenatal care.  She is currently monitored for the following issues for this high-risk pregnancy and has AMA (advanced maternal age) multigravida 58+; Supervision of other normal pregnancy, antepartum; and Language barrier on their problem list.  Patient reports dysuria at the end of voiding. no smell to urine. no contractions today but some in the past. .   Contractions: Not present. Vag. Bleeding: None.  Movement: Present. Denies leaking of fluid.   The following portions of the patient's history were reviewed and updated as appropriate: allergies, current medications, past family history, past medical history, past social history, past surgical history and problem list. Problem list updated.  Objective:   Vitals:   11/13/18 0909  BP: 100/66  Pulse: 85  Weight: 128 lb (58.1 kg)    Fetal Status: Fetal Heart Rate (bpm): 154 Fundal Height: 30 cm Movement: Present     General:  Alert, oriented and cooperative. Patient is in no acute distress.  Skin: Skin is warm and dry. No rash noted.   Cardiovascular: Normal heart rate noted  Respiratory: Normal respiratory effort, no problems with respiration noted  Abdomen: Soft, gravid, appropriate for gestational age. Pain/Pressure: Present     Pelvic:  Cervical exam deferred        Extremities: Normal range of motion.  Edema: None  Mental Status: Normal mood and affect. Normal behavior. Normal judgment and thought content.    Assessment and Plan:  Pregnancy: [id number] at [redacted]w[redacted]d  1. Multigravida of advanced maternal age in third trimester No issues  2. Language barrier Interpreter used  3. Supervision of other normal pregnancy, antepartum Routine care. nexplanon  4. Dysuria during pregnancy in third trimester Urine dip just trace blood. Continue to follow  Preterm  labor symptoms and general obstetric precautions including but not limited to vaginal bleeding, contractions, leaking of fluid and fetal movement were reviewed in detail with the patient. Please refer to After Visit Summary for other counseling recommendations.  Return in about 3 weeks (around 12/04/2018).   Aletha Halim, MD

## 2018-11-14 LAB — CBC
Hematocrit: 34.5 % (ref 34.0–46.6)
Hemoglobin: 11.7 g/dL (ref 11.1–15.9)
MCH: 28.7 pg (ref 26.6–33.0)
MCHC: 33.9 g/dL (ref 31.5–35.7)
MCV: 85 fL (ref 79–97)
Platelets: 221 10*3/uL (ref 150–450)
RBC: 4.08 x10E6/uL (ref 3.77–5.28)
RDW: 11.5 % — ABNORMAL LOW (ref 11.7–15.4)
WBC: 5.9 10*3/uL (ref 3.4–10.8)

## 2018-11-14 LAB — HIV ANTIBODY (ROUTINE TESTING W REFLEX): HIV Screen 4th Generation wRfx: NONREACTIVE

## 2018-11-14 LAB — GLUCOSE TOLERANCE, 2 HOURS W/ 1HR
Glucose, 1 hour: 96 mg/dL (ref 65–179)
Glucose, 2 hour: 73 mg/dL (ref 65–152)
Glucose, Fasting: 71 mg/dL (ref 65–91)

## 2018-11-14 LAB — RPR: RPR Ser Ql: NONREACTIVE

## 2018-11-24 ENCOUNTER — Encounter (HOSPITAL_COMMUNITY): Payer: Self-pay

## 2018-11-24 ENCOUNTER — Ambulatory Visit (HOSPITAL_COMMUNITY): Payer: Self-pay

## 2018-12-10 ENCOUNTER — Telehealth: Payer: Self-pay | Admitting: Obstetrics & Gynecology

## 2018-12-10 NOTE — Telephone Encounter (Signed)
Called with the in-office interrupter Raquel. Informed the patient of the upcoming visit as well as our office location. Informed the patient of no children or visitors due to the COVID19 restrictions. The patient also answered no to covid19 questionnaire.  °

## 2018-12-11 ENCOUNTER — Encounter: Payer: Self-pay | Admitting: Medical

## 2018-12-11 ENCOUNTER — Other Ambulatory Visit: Payer: Self-pay

## 2018-12-11 ENCOUNTER — Ambulatory Visit (INDEPENDENT_AMBULATORY_CARE_PROVIDER_SITE_OTHER): Payer: Self-pay | Admitting: Medical

## 2018-12-11 ENCOUNTER — Encounter: Payer: Self-pay | Admitting: Family Medicine

## 2018-12-11 VITALS — BP 112/72 | HR 92 | Wt 132.9 lb

## 2018-12-11 DIAGNOSIS — Z3A33 33 weeks gestation of pregnancy: Secondary | ICD-10-CM

## 2018-12-11 DIAGNOSIS — O09523 Supervision of elderly multigravida, third trimester: Secondary | ICD-10-CM

## 2018-12-11 DIAGNOSIS — Z3480 Encounter for supervision of other normal pregnancy, unspecified trimester: Secondary | ICD-10-CM

## 2018-12-11 LAB — POCT URINALYSIS DIP (DEVICE)
Bilirubin Urine: NEGATIVE
Glucose, UA: NEGATIVE mg/dL
Hgb urine dipstick: NEGATIVE
Ketones, ur: NEGATIVE mg/dL
Nitrite: NEGATIVE
Protein, ur: NEGATIVE mg/dL
Specific Gravity, Urine: 1.025 (ref 1.005–1.030)
Urobilinogen, UA: 0.2 mg/dL (ref 0.0–1.0)
pH: 6 (ref 5.0–8.0)

## 2018-12-11 NOTE — Progress Notes (Signed)
   PRENATAL VISIT NOTE  Subjective:  Marie Bridges is a 38 y.o. [phone] at [redacted]w[redacted]d being seen today for ongoing prenatal care.  She is currently monitored for the following issues for this high-risk pregnancy and has AMA (advanced maternal age) multigravida 19+; Supervision of other normal pregnancy, antepartum; and Language barrier on their problem list.  Patient reports backache, occasional contractions and vaginal irritation.  Contractions: Irritability. Vag. Bleeding: None.  Movement: Present. Denies leaking of fluid.   The following portions of the patient's history were reviewed and updated as appropriate: allergies, current medications, past family history, past medical history, past social history, past surgical history and problem list.   Objective:   Vitals:   12/11/18 0949  BP: 112/72  Pulse: 92  Weight: 132 lb 14.4 oz (60.3 kg)    Fetal Status: Fetal Heart Rate (bpm): 156 Fundal Height: 32 cm Movement: Present     General:  Alert, oriented and cooperative. Patient is in no acute distress.  Skin: Skin is warm and dry. No rash noted.   Cardiovascular: Normal heart rate noted  Respiratory: Normal respiratory effort, no problems with respiration noted  Abdomen: Soft, gravid, appropriate for gestational age.  Pain/Pressure: Present     Pelvic: Cervical exam deferred        Extremities: Normal range of motion.  Edema: None  Mental Status: Normal mood and affect. Normal behavior. Normal judgment and thought content.   Assessment and Plan:  Pregnancy: [id number] at [redacted]w[redacted]d 1. Supervision of other normal pregnancy, antepartum - Doing well - Intermittent BH contractions - Occasional back pain when sedentary   2. Multigravida of advanced maternal age in third trimester  Preterm labor symptoms and general obstetric precautions including but not limited to vaginal bleeding, contractions, leaking of fluid and fetal movement were reviewed in detail with the patient. Please refer to  After Visit Summary for other counseling recommendations.   Return in about 3 weeks (around 01/01/2019) for LOB, In-Person. - GBS and GC/Chlamydia at next visit   Future Appointments  Date Time Provider [place]  12/31/2018  9:35 AM Rasch, Artist Pais, NP [facility]    Kerry Hough, PA-C

## 2018-12-11 NOTE — Patient Instructions (Signed)
Evaluacin de los movimientos fetales Fetal Movement Counts Introduccin Nombre del paciente: ________________________________________________ Marie Bridges estimada: ____________________ Young Berry evaluacin de los movimientos fetales?  Una evaluacin de los movimientos fetales es el registro del nmero de veces que siente que el beb se mueve durante un cierto perodo de [place]. Esto tambin se puede denominar recuento de patadas fetales. Una evaluacin de movimientos fetales se recomienda a todas las embarazadas. Es posible que le indiquen que comience a Development worker, community los movimientos fetales desde la semana 28 de [place]. Preste atencin cuando sienta que el beb est ms activo. Podr detectar los ciclos en que el beb duerme y est despierto. Tambin podr detectar que ciertas cosas hacen que su beb se mueva ms. Deber realizar una evaluacin de los movimientos fetales en las siguientes situaciones:  Cuando el beb est ms activo habitualmente.  A la [employer], todos los [place]. Un buen momento para evaluar los movimientos fetales es cuando est descansando, despus de haber comido y bebido algo. Cmo debo contar los movimientos fetales? 1. Encuentre un lugar tranquilo y cmodo. Sintese o acustese de lado. 2. Anote la fecha, la hora de inicio y de finalizacin y la cantidad de movimientos que sinti entre esas dos horas. Lleve esta informacin a las visitas de control. 3. Cuente las pataditas, revoloteos, chasquidos, vueltas o pinchazos en un perodo de 2horas. Debe sentir al menos en 2horas. 4. Cuando sienta , puede dejar de contar. 5. Si no siente en 2horas, coma y beba algo. Luego, contine descansando y Industrial/product designer. Si siente al menos durante esa hora, puede dejar de contar. Comunquese con un mdico si:  Siente menos de en 2horas.  El beb no se mueve tanto como suele hacerlo. Fecha:  ____________ [name] inicio: ____________ [name] finalizacin: ____________ Movimientos: ____________ [name]: ____________ [name] inicio: ____________ [name] finalizacin: ____________ Movimientos: ____________ [name]: ____________ [name] inicio: ____________ [name] finalizacin: ____________ Movimientos: ____________ [name]: ____________ [name] inicio: ____________ [name] finalizacin: ____________ Movimientos: ____________ [name]: ____________ [name] inicio: ____________ [name] de finalizacin: ____________ Movimientos: ____________ [name]: ____________ [name] inicio: ____________ [name] de finalizacin: ____________ Movimientos: ____________ [name]: ____________ [name] inicio: ____________ [name] de finalizacin: ____________ Movimientos: ____________ [name]: ____________ [name] inicio: ____________ [name] finalizacin: ____________ Movimientos: ____________ [name]: ____________ [name] inicio: ____________ [name] de finalizacin: ____________ Movimientos: ____________ Esta informacin no tiene como fin reemplazar el consejo del mdico. Asegrese de hacerle al mdico cualquier pregunta que tenga. Document Released: 07/30/2007 Document Revised: 07/26/2016 Document Reviewed: 06/01/2015 Elsevier Patient Education  2020 [employer]. [employer] de Braxton Hicks Braxton Hicks Contractions Las contracciones del tero pueden presentarse durante todo el [place], [place] no siempre indican que la mujer est de [place]. Es posible que usted haya tenido contracciones de prctica llamadas "contracciones de Varnell". A veces, se las confunde con el parto real. Qu son las contracciones de [place]? Las contracciones de [place] son espasmos que se producen en los msculos del tero antes del [place]. A diferencia de las contracciones del parto verdadero, estas no producen el agrandamiento (la dilatacin) ni el afinamiento del cuello uterino. Hacia el final del embarazo [facility] las semanas [phone]),  las contracciones de Braxton Hicks pueden presentarse ms seguido y tornarse ms intensas. A veces, resulta difcil distinguirlas del parto verdadero porque pueden ser [employer]. No debe sentirse avergonzada si concurre al hospital con falso parto. En ocasiones, la nica forma de saber si el [name]  de parto es verdadero es que el mdico determine si hay cambios en el cuello del tero. El mdico le har un examen fsico y quizs le controle las contracciones. Si usted no est de parto verdadero, el examen debe indicar que el cuello uterino no est dilatado y que usted no ha roto bolsa. Si no hay otros problemas de salud asociados con su embarazo, no habr inconvenientes si la envan a su casa con un falso parto. Es posible que las contracciones de Braxton Hicks continen hasta que se desencadene el parto verdadero. Cmo diferenciar el trabajo de parto falso del verdadero Trabajo de parto verdadero  Las contracciones duran de 30a70segundos.  Las contracciones pueden tornarse muy regulares.  La molestia generalmente se siente en la parte superior del tero y se extiende hacia la zona baja del abdomen y hacia la cintura.  Las contracciones no desaparecen cuando usted camina.  Las contracciones generalmente se hacen ms intensas y aumentan en frecuencia.  El cuello uterino se dilata y se afina. Parto falso  En general, las contracciones son ms cortas y no tan intensas como las del parto verdadero.  En general, las contracciones son irregulares.  A menudo, las contracciones se sienten en la parte delantera de la parte baja del abdomen y en la ingle.  Las contracciones pueden desaparecer cuando usted camina o cambia de posicin mientras est acostada.  Las contracciones se vuelven ms dbiles y su duracin es menor a medida que transcurre el tiempo.  En general, el cuello uterino no se dilata ni se afina. Siga estas indicaciones en su casa:   Tome los medicamentos de venta libre y  los recetados solamente como se lo haya indicado el mdico.  Contine haciendo los ejercicios habituales y siga las dems indicaciones que el mdico le d.  Coma y beba con moderacin si cree que est de parto.  Si las contracciones de Braxton Hicks le provocan incomodidad: ? Cambie de posicin: si est acostada o descansando, camine; si est caminando, descanse. ? Sintese y descanse en una baera con agua tibia. ? Beba suficiente lquido como para mantener la orina de color amarillo plido. La deshidratacin puede provocar contracciones. ? Respire lenta y profundamente varias veces por hora.  Vaya a todas las visitas de control prenatales y de control como se lo haya indicado el mdico. Esto es importante. Comunquese con un mdico si:  Tiene fiebre.  Siente dolor constante en el abdomen. Solicite ayuda de inmediato si:  Las contracciones se intensifican, se hacen ms regulares y cercanas entre s.  Tiene una prdida de lquido por la vagina.  Elimina una mucosidad sanguinolenta (prdida del tapn mucoso).  Tiene una hemorragia vaginal.  Tiene un dolor en la zona lumbar que nunca tuvo antes.  Siente que la cabeza del beb empuja hacia abajo y ejerce presin en la zona plvica.  El beb no se mueve tanto como antes. Resumen  Las contracciones que se presentan antes del parto se conocen como contracciones de Braxton Hicks, falso parto o contracciones de prctica.  En general, las contracciones de Braxton Hicks son ms cortas, ms dbiles, con ms tiempo entre una y otra, y menos regulares que las contracciones del parto verdadero. Las contracciones del parto verdadero se intensifican progresivamente y se tornan regulares y ms frecuentes.  Para controlar la molestia que producen las contracciones de Braxton Hicks, puede cambiar de posicin, darse un bao templado y descansar, beber mucha agua o practicar la respiracin profunda. Esta informacin no tiene como   fin reemplazar el  consejo del mdico. Asegrese de hacerle al mdico cualquier pregunta que tenga. Document Released: 12/02/2016 Document Revised: 08/01/2017 Document Reviewed: 12/02/2016 Elsevier Patient Education  2020 [employer].

## 2018-12-30 ENCOUNTER — Telehealth: Payer: Self-pay | Admitting: Obstetrics and Gynecology

## 2018-12-30 NOTE — Telephone Encounter (Signed)
Spanish interpreter [name] called patient about her appointment on 8/27 @ 9:35. Patient instructed to wear a face mask for the entire appointment and no visitors are allowed. Patient screened for covid symptoms and denied having any.

## 2018-12-31 ENCOUNTER — Ambulatory Visit (INDEPENDENT_AMBULATORY_CARE_PROVIDER_SITE_OTHER): Payer: Self-pay | Admitting: Obstetrics and Gynecology

## 2018-12-31 ENCOUNTER — Other Ambulatory Visit: Payer: Self-pay

## 2018-12-31 ENCOUNTER — Telehealth: Payer: Self-pay | Admitting: Obstetrics and Gynecology

## 2018-12-31 VITALS — BP 115/71 | HR 82 | Wt 135.0 lb

## 2018-12-31 DIAGNOSIS — Z23 Encounter for immunization: Secondary | ICD-10-CM

## 2018-12-31 DIAGNOSIS — Z3480 Encounter for supervision of other normal pregnancy, unspecified trimester: Secondary | ICD-10-CM

## 2018-12-31 DIAGNOSIS — O09523 Supervision of elderly multigravida, third trimester: Secondary | ICD-10-CM

## 2018-12-31 DIAGNOSIS — Z113 Encounter for screening for infections with a predominantly sexual mode of transmission: Secondary | ICD-10-CM

## 2018-12-31 LAB — OB RESULTS CONSOLE GBS: GBS: NEGATIVE

## 2018-12-31 NOTE — Progress Notes (Signed)
   PRENATAL VISIT NOTE  Subjective:  Marie Bridges is a 38 y.o. [phone] at [redacted]w[redacted]d being seen today for ongoing prenatal care.  She is currently monitored for the following issues for this low-risk pregnancy and has AMA (advanced maternal age) multigravida 93+; Supervision of other normal pregnancy, antepartum; and Language barrier on their problem list.  Patient reports no complaints.  Contractions: Irregular. Vag. Bleeding: None.  Movement: Present. Denies leaking of fluid.   The following portions of the patient's history were reviewed and updated as appropriate: allergies, current medications, past family history, past medical history, past social history, past surgical history and problem list.   Objective:   Vitals:   12/31/18 0926  BP: 115/71  Pulse: 82  Weight: 135 lb (61.2 kg)    Fetal Status: Fetal Heart Rate (bpm): 156 Fundal Height: 34 cm Movement: Present  Presentation: Undeterminable  General:  Alert, oriented and cooperative. Patient is in no acute distress.  Skin: Skin is warm and dry. No rash noted.   Cardiovascular: Normal heart rate noted  Respiratory: Normal respiratory effort, no problems with respiration noted  Abdomen: Soft, gravid, appropriate for gestational age.  Pain/Pressure: Present     Pelvic: Cervical exam performed Dilation: Closed Effacement (%): Thick Station: -3  Extremities: Normal range of motion.  Edema: None  Mental Status: Normal mood and affect. Normal behavior. Normal judgment and thought content.   Assessment and Plan:  Pregnancy: [id number] at [redacted]w[redacted]d  1. Supervision of other normal pregnancy, antepartum  - Culture, beta strep (group b only) - GC/Chlamydia probe amp ([place])not at [facility] - Flu Vaccine QUAD 36+ mos IM - [place] MFM OB FOLLOW UP; Future - Patient requests in- office visits   2. Multigravida of advanced maternal age in third trimester  - [place] MFM OB FOLLOW UP; Future - fundal height 34 weeks today. MFM recommended growth  at 32 weeks.   Preterm labor symptoms and general obstetric precautions including but not limited to vaginal bleeding, contractions, leaking of fluid and fetal movement were reviewed in detail with the patient. Please refer to After Visit Summary for other counseling recommendations.   No follow-ups on file.  Future Appointments  Date Time Provider [place]  01/13/2019  2:15 PM [place], NP

## 2018-12-31 NOTE — Telephone Encounter (Signed)
The patient was issued a second financial application. The patient stated she didn't fill the first one our because she is not working. Informed the patient she can apply without employment, submit the information that you do have. The patient verbalized understanding. Informed the patient of returning the application to our office or mailing the information to the address listed at the bottom of the application.

## 2019-01-01 LAB — GC/CHLAMYDIA PROBE AMP (~~LOC~~) NOT AT ARMC
Chlamydia: NEGATIVE
Neisseria Gonorrhea: NEGATIVE

## 2019-01-04 LAB — CULTURE, BETA STREP (GROUP B ONLY): Strep Gp B Culture: NEGATIVE

## 2019-01-06 ENCOUNTER — Telehealth: Payer: Self-pay | Admitting: Obstetrics and Gynecology

## 2019-01-06 NOTE — Telephone Encounter (Signed)
Spanish interpreter [name] called patient about her appointment on 9/3 @ 10:35. Patient instructed to wear a face mask for the entire appointment and no visitors are allowed. Patient screened for covid symptoms and denied having any.

## 2019-01-07 ENCOUNTER — Ambulatory Visit (INDEPENDENT_AMBULATORY_CARE_PROVIDER_SITE_OTHER): Payer: Self-pay | Admitting: Obstetrics and Gynecology

## 2019-01-07 ENCOUNTER — Inpatient Hospital Stay (HOSPITAL_COMMUNITY)
Admission: AD | Admit: 2019-01-07 | Discharge: 2019-01-07 | Disposition: A | Discharge status: Home / Self Care | Payer: Self-pay | Attending: Obstetrics & Gynecology | Admitting: Obstetrics & Gynecology

## 2019-01-07 ENCOUNTER — Other Ambulatory Visit: Payer: Self-pay

## 2019-01-07 ENCOUNTER — Encounter (HOSPITAL_COMMUNITY): Payer: Self-pay | Admitting: *Deleted

## 2019-01-07 VITALS — BP 111/77 | HR 80 | Wt 137.0 lb

## 2019-01-07 DIAGNOSIS — Z3A36 36 weeks gestation of pregnancy: Secondary | ICD-10-CM | POA: Insufficient documentation

## 2019-01-07 DIAGNOSIS — O4703 False labor before 37 completed weeks of gestation, third trimester: Secondary | ICD-10-CM | POA: Insufficient documentation

## 2019-01-07 DIAGNOSIS — O09523 Supervision of elderly multigravida, third trimester: Secondary | ICD-10-CM | POA: Insufficient documentation

## 2019-01-07 DIAGNOSIS — Z3480 Encounter for supervision of other normal pregnancy, unspecified trimester: Secondary | ICD-10-CM

## 2019-01-07 DIAGNOSIS — Z789 Other specified health status: Secondary | ICD-10-CM

## 2019-01-07 DIAGNOSIS — O479 False labor, unspecified: Secondary | ICD-10-CM

## 2019-01-07 DIAGNOSIS — Z603 Acculturation difficulty: Secondary | ICD-10-CM

## 2019-01-07 DIAGNOSIS — Z3483 Encounter for supervision of other normal pregnancy, third trimester: Secondary | ICD-10-CM

## 2019-01-07 NOTE — Progress Notes (Signed)
   PRENATAL VISIT NOTE  Subjective:  Marie Bridges is a 38 y.o. [phone] at [redacted]w[redacted]d being seen today for ongoing prenatal care.  She is currently monitored for the following issues for this low-risk pregnancy and has AMA (advanced maternal age) multigravida 55+; Supervision of other normal pregnancy, antepartum; and Language barrier on their problem list.  Patient reports contractions since yesterday.  Contractions: Irritability. Vag. Bleeding: None, [place].  Movement: Present. Denies leaking of fluid.   The following portions of the patient's history were reviewed and updated as appropriate: allergies, current medications, past family history, past medical history, past social history, past surgical history and problem list.   Objective:   Vitals:   01/07/19 1054  BP: 111/77  Pulse: 80  Weight: 137 lb (62.1 kg)    Fetal Status: Fetal Heart Rate (bpm): 154 Fundal Height: 35 cm Movement: Present  Presentation: Vertex  General:  Alert, oriented and cooperative. Patient is in no acute distress.  Skin: Skin is warm and dry. No rash noted.   Cardiovascular: Normal heart rate noted  Respiratory: Normal respiratory effort, no problems with respiration noted  Abdomen: Soft, gravid, appropriate for gestational age.  Pain/Pressure: Present     Pelvic: Cervical exam performed Dilation: 2.5 Effacement (%): 50 Station: Ballotable, -3  Extremities: Normal range of motion.  Edema: None  Mental Status: Normal mood and affect. Normal behavior. Normal judgment and thought content.   Assessment and Plan:  Pregnancy: [id number] at [redacted]w[redacted]d  1. Supervision of other normal pregnancy, antepartum  Likely In early labor Go to MAU if contractions become regular.  GBS negative     There are no diagnoses linked to this encounter. Preterm labor symptoms and general obstetric precautions including but not limited to vaginal bleeding, contractions, leaking of fluid and fetal movement were reviewed in  detail with the patient. Please refer to After Visit Summary for other counseling recommendations.   No follow-ups on file.  Future Appointments  Date Time Provider [place]  01/13/2019  2:15 PM [place], NP

## 2019-01-07 NOTE — Patient Instructions (Signed)
Address: [address] [place], [place], [place] [address] Entrance C for parking.

## 2019-01-07 NOTE — MAU Provider Note (Signed)
None      S: Ms. Marie Bridges is a 38 y.o. [phone] at [redacted]w[redacted]d  who presents to MAU today complaining contractions q 2-3 minutes today. She denies vaginal bleeding. She denies LOF. She reports normal fetal movement.    O: BP 115/69   Pulse (!) 103   Temp 98.9 F (37.2 C)   Resp 18   Ht 5' 0.5" (1.537 m)   Wt 62.6 kg   LMP 04/24/2018   BMI 26.51 kg/m  GENERAL: Well-developed, well-nourished female in no acute distress.  HEAD: Normocephalic, atraumatic.  CHEST: Normal effort of breathing, regular heart rate ABDOMEN: Soft, nontender, gravid  Cervical exam:  Dilation: 2.5 Effacement (%): 50 Cervical Position: Posterior Station: -2 Presentation: Vertex Exam by:: K.Wilson,RN   Fetal Monitoring: Baseline: 150 Variability: moderate Accelerations: present Decelerations: isolated variable, followed by 45+  Minutes of reactive NST with no decels Contractions: rare   A: SIUP at [redacted]w[redacted]d  False labor  P: D/C home with labor precautions F/u in the office as scheduled  Simona Huh 01/07/2019 7:37 PM

## 2019-01-07 NOTE — MAU Note (Signed)
Pt having contractions since this morning had a appointment. C/O vaginal bleeding prior to appointment. 2.5 cm in office. Ctx are now 3 min apart. Good fetal movement reported

## 2019-01-11 ENCOUNTER — Other Ambulatory Visit: Payer: Self-pay

## 2019-01-11 ENCOUNTER — Inpatient Hospital Stay (HOSPITAL_COMMUNITY)
Admission: AD | Admit: 2019-01-11 | Discharge: 2019-01-13 | DRG: 807 | Disposition: A | Discharge status: Home / Self Care | Payer: Medicaid Other | Attending: Obstetrics and Gynecology | Admitting: Obstetrics and Gynecology

## 2019-01-11 ENCOUNTER — Encounter (HOSPITAL_COMMUNITY): Payer: Self-pay | Admitting: *Deleted

## 2019-01-11 ENCOUNTER — Inpatient Hospital Stay (HOSPITAL_COMMUNITY): Payer: Medicaid Other | Admitting: Anesthesiology

## 2019-01-11 DIAGNOSIS — Z3A37 37 weeks gestation of pregnancy: Secondary | ICD-10-CM

## 2019-01-11 DIAGNOSIS — Z789 Other specified health status: Secondary | ICD-10-CM

## 2019-01-11 DIAGNOSIS — Z23 Encounter for immunization: Secondary | ICD-10-CM | POA: Diagnosis not present

## 2019-01-11 DIAGNOSIS — Z3480 Encounter for supervision of other normal pregnancy, unspecified trimester: Secondary | ICD-10-CM

## 2019-01-11 DIAGNOSIS — O4292 Full-term premature rupture of membranes, unspecified as to length of time between rupture and onset of labor: Principal | ICD-10-CM | POA: Diagnosis present

## 2019-01-11 DIAGNOSIS — O4202 Full-term premature rupture of membranes, onset of labor within 24 hours of rupture: Secondary | ICD-10-CM | POA: Diagnosis not present

## 2019-01-11 DIAGNOSIS — Z20828 Contact with and (suspected) exposure to other viral communicable diseases: Secondary | ICD-10-CM | POA: Diagnosis present

## 2019-01-11 LAB — CBC
HCT: 33.2 % — ABNORMAL LOW (ref 36.0–46.0)
Hemoglobin: 10.8 g/dL — ABNORMAL LOW (ref 12.0–15.0)
MCH: 27.6 pg (ref 26.0–34.0)
MCHC: 32.5 g/dL (ref 30.0–36.0)
MCV: 84.7 fL (ref 80.0–100.0)
Platelets: 195 10*3/uL (ref 150–400)
RBC: 3.92 MIL/uL (ref 3.87–5.11)
RDW: 12.9 % (ref 11.5–15.5)
WBC: 6.5 10*3/uL (ref 4.0–10.5)
nRBC: 0 % (ref 0.0–0.2)

## 2019-01-11 LAB — TYPE AND SCREEN
ABO/RH(D): O POS
Antibody Screen: NEGATIVE

## 2019-01-11 LAB — POCT FERN TEST: POCT Fern Test: POSITIVE

## 2019-01-11 LAB — SARS CORONAVIRUS 2 BY RT PCR (HOSPITAL ORDER, PERFORMED IN ~~LOC~~ HOSPITAL LAB): SARS Coronavirus 2: NEGATIVE

## 2019-01-11 MED ORDER — COCONUT OIL OIL: 1.0000 "application " | TOPICAL_OIL | Status: DC | PRN

## 2019-01-11 MED ORDER — TETANUS-DIPHTH-ACELL PERTUSSIS 5-2.5-18.5 LF-MCG/0.5 IM SUSP: 0.5000 mL | Freq: Once | INTRAMUSCULAR | Status: DC

## 2019-01-11 MED ORDER — ZOLPIDEM TARTRATE 5 MG PO TABS: 5.0000 mg | ORAL_TABLET | Freq: Every evening | ORAL | Status: DC | PRN

## 2019-01-11 MED ORDER — OXYTOCIN 40 UNITS IN NORMAL SALINE INFUSION - SIMPLE MED: 2.5000 [IU]/h | INTRAVENOUS | Status: DC

## 2019-01-11 MED ORDER — FENTANYL CITRATE (PF) 100 MCG/2ML IJ SOLN
50.0000 ug | INTRAMUSCULAR | Status: DC | PRN
  Administered 2019-01-11: 17:00:00 100 ug via INTRAVENOUS
  Filled 2019-01-11: qty 2

## 2019-01-11 MED ORDER — ONDANSETRON HCL 4 MG PO TABS: 4.0000 mg | ORAL_TABLET | ORAL | Status: DC | PRN

## 2019-01-11 MED ORDER — OXYTOCIN BOLUS FROM INFUSION
500.0000 mL | Freq: Once | INTRAVENOUS | Status: AC
  Administered 2019-01-11: 500 mL via INTRAVENOUS

## 2019-01-11 MED ORDER — PHENYLEPHRINE 40 MCG/ML (10ML) SYRINGE FOR IV PUSH (FOR BLOOD PRESSURE SUPPORT): 80.0000 ug | PREFILLED_SYRINGE | INTRAVENOUS | Status: DC | PRN

## 2019-01-11 MED ORDER — WITCH HAZEL-GLYCERIN EX PADS: 1.0000 "application " | MEDICATED_PAD | CUTANEOUS | Status: DC | PRN

## 2019-01-11 MED ORDER — OXYCODONE-ACETAMINOPHEN 5-325 MG PO TABS: 2.0000 | ORAL_TABLET | ORAL | Status: DC | PRN

## 2019-01-11 MED ORDER — ONDANSETRON HCL 4 MG/2ML IJ SOLN: 4.0000 mg | Freq: Four times a day (QID) | INTRAMUSCULAR | Status: DC | PRN

## 2019-01-11 MED ORDER — ACETAMINOPHEN 325 MG PO TABS
650.0000 mg | ORAL_TABLET | ORAL | Status: DC | PRN
  Administered 2019-01-12 (×2): 650 mg via ORAL
  Filled 2019-01-11 (×2): qty 2

## 2019-01-11 MED ORDER — SIMETHICONE 80 MG PO CHEW: 80.0000 mg | CHEWABLE_TABLET | ORAL | Status: DC | PRN

## 2019-01-11 MED ORDER — SODIUM CHLORIDE (PF) 0.9 % IJ SOLN
INTRAMUSCULAR | Status: DC | PRN
  Administered 2019-01-11: 12 mL/h via EPIDURAL

## 2019-01-11 MED ORDER — PRENATAL MULTIVITAMIN CH
1.0000 | ORAL_TABLET | Freq: Every day | ORAL | Status: DC
  Administered 2019-01-12 – 2019-01-13 (×2): 1 via ORAL
  Filled 2019-01-11 (×2): qty 1

## 2019-01-11 MED ORDER — LACTATED RINGERS IV SOLN
INTRAVENOUS | Status: DC
  Administered 2019-01-11 (×2): via INTRAVENOUS

## 2019-01-11 MED ORDER — EPHEDRINE 5 MG/ML INJ: 10.0000 mg | INTRAVENOUS | Status: DC | PRN

## 2019-01-11 MED ORDER — OXYCODONE-ACETAMINOPHEN 5-325 MG PO TABS: 1.0000 | ORAL_TABLET | ORAL | Status: DC | PRN

## 2019-01-11 MED ORDER — OXYTOCIN 40 UNITS IN NORMAL SALINE INFUSION - SIMPLE MED
1.0000 m[IU]/min | INTRAVENOUS | Status: DC
  Administered 2019-01-11: 2 m[IU]/min via INTRAVENOUS
  Filled 2019-01-11: qty 1000

## 2019-01-11 MED ORDER — BENZOCAINE-MENTHOL 20-0.5 % EX AERO
1.0000 "application " | INHALATION_SPRAY | CUTANEOUS | Status: DC | PRN
  Administered 2019-01-12: 1 via TOPICAL
  Filled 2019-01-11: qty 56

## 2019-01-11 MED ORDER — DIPHENHYDRAMINE HCL 25 MG PO CAPS: 25.0000 mg | ORAL_CAPSULE | Freq: Four times a day (QID) | ORAL | Status: DC | PRN

## 2019-01-11 MED ORDER — IBUPROFEN 600 MG PO TABS
600.0000 mg | ORAL_TABLET | Freq: Four times a day (QID) | ORAL | Status: DC
  Administered 2019-01-12 – 2019-01-13 (×7): 600 mg via ORAL
  Filled 2019-01-11 (×7): qty 1

## 2019-01-11 MED ORDER — DIBUCAINE (PERIANAL) 1 % EX OINT: 1.0000 "application " | TOPICAL_OINTMENT | CUTANEOUS | Status: DC | PRN

## 2019-01-11 MED ORDER — HYDROXYZINE HCL 50 MG PO TABS: 50.0000 mg | ORAL_TABLET | Freq: Four times a day (QID) | ORAL | Status: DC | PRN

## 2019-01-11 MED ORDER — SOD CITRATE-CITRIC ACID 500-334 MG/5ML PO SOLN: 30.0000 mL | ORAL | Status: DC | PRN

## 2019-01-11 MED ORDER — LACTATED RINGERS IV SOLN: 500.0000 mL | INTRAVENOUS | Status: DC | PRN

## 2019-01-11 MED ORDER — ACETAMINOPHEN 325 MG PO TABS: 650.0000 mg | ORAL_TABLET | ORAL | Status: DC | PRN

## 2019-01-11 MED ORDER — TERBUTALINE SULFATE 1 MG/ML IJ SOLN: 0.2500 mg | Freq: Once | INTRAMUSCULAR | Status: DC | PRN

## 2019-01-11 MED ORDER — LIDOCAINE HCL (PF) 1 % IJ SOLN
INTRAMUSCULAR | Status: DC | PRN
  Administered 2019-01-11: 10 mL via EPIDURAL

## 2019-01-11 MED ORDER — SENNOSIDES-DOCUSATE SODIUM 8.6-50 MG PO TABS
2.0000 | ORAL_TABLET | ORAL | Status: DC
  Administered 2019-01-11 – 2019-01-12 (×2): 2 via ORAL
  Filled 2019-01-11 (×2): qty 2

## 2019-01-11 MED ORDER — DIPHENHYDRAMINE HCL 50 MG/ML IJ SOLN: 12.5000 mg | INTRAMUSCULAR | Status: DC | PRN

## 2019-01-11 MED ORDER — LACTATED RINGERS IV SOLN
500.0000 mL | Freq: Once | INTRAVENOUS | Status: AC
  Administered 2019-01-11: 17:00:00 500 mL via INTRAVENOUS

## 2019-01-11 MED ORDER — ONDANSETRON HCL 4 MG/2ML IJ SOLN: 4.0000 mg | INTRAMUSCULAR | Status: DC | PRN

## 2019-01-11 MED ORDER — FENTANYL-BUPIVACAINE-NACL 0.5-0.125-0.9 MG/250ML-% EP SOLN
12.0000 mL/h | EPIDURAL | Status: DC | PRN
  Filled 2019-01-11: qty 250

## 2019-01-11 MED ORDER — FLEET ENEMA 7-19 GM/118ML RE ENEM: 1.0000 | ENEMA | RECTAL | Status: DC | PRN

## 2019-01-11 MED ORDER — LIDOCAINE HCL (PF) 1 % IJ SOLN: 30.0000 mL | INTRAMUSCULAR | Status: DC | PRN

## 2019-01-11 NOTE — H&P (Addendum)
[address] [name] is a 38 y.o. female [phone] with IUP at [redacted]w[redacted]d presenting for PROM. Pt states she has been having irregular, every 10 minutes contractions, associated with none vaginal bleeding for 5 hours.  Membranes are  ruptured, clear fluid, with active fetal movement.   [facility] at [facility] wks. +FM, no vaginal bleeding, HA, LE swelling.  Prenatal History/Complications: AMA Multigravida  Past Medical History: Past Medical History:  Diagnosis Date  . Bilateral ovarian cysts   . Pregnancy induced hypertension   . Preterm labor   . Stomach ulcer   . UTI (urinary tract infection)     Past Surgical History: Past Surgical History:  Procedure Laterality Date  . NO PAST SURGERIES      Obstetrical History: OB History    Gravida  4   Para  3   Term  2   Preterm  1   AB  0   Living  3     SAB  0   TAB  0   Ectopic  0   Multiple  0   Live Births  3            Social History: Social History   Socioeconomic History  . Marital status: Significant Other    Spouse name: Not on file  . Number of children: Not on file  . Years of education: less than high school, 6th  . Highest education level: Not on file  Occupational History  . Not on file  Social Needs  . Financial resource strain: Not on file  . Food insecurity    Worry: Not on file    Inability: Not on file  . Transportation needs    Medical: Not on file    Non-medical: Not on file  Tobacco Use  . Smoking status: Never Smoker  . Smokeless tobacco: Never Used  Substance and Sexual Activity  . Alcohol use: Not Currently  . Drug use: Not Currently  . Sexual activity: Yes    Birth control/protection: None    Comment: hormonal injection and female condom in the past  Lifestyle  . Physical activity    Days per week: Not on file    Minutes per session: Not on file  . Stress: Not on file  Relationships  . Social Musician on phone: Not on file    Gets together: Not on file   Attends religious service: Not on file    Active member of club or organization: Not on file    Attends meetings of clubs or organizations: Not on file    Relationship status: Not on file  Other Topics Concern  . Not on file  Social History Narrative  . Not on file    Family History: History reviewed. No pertinent family history.  Allergies: No Known Allergies  Medications Prior to Admission  Medication Sig Dispense Refill Last Dose  . aspirin 81 MG chewable tablet Chew 1 tablet (81 mg total) by mouth daily. 30 tablet 9 01/11/2019 at Unknown time  . omeprazole (PRILOSEC) 20 MG capsule Take 1 capsule (20 mg total) by mouth daily. 30 capsule 9 01/11/2019 at Unknown time  . prenatal vitamin w/FE, FA (PRENATAL 1 + 1) 27-1 MG TABS tablet Take 1 tablet by mouth daily at 12 noon.   01/11/2019 at Unknown time        Review of Systems   Constitutional: Negative for fever and chills Eyes: Negative for visual disturbances Respiratory: Negative for  shortness of breath, dyspnea Cardiovascular: Negative for chest pain or palpitations  Gastrointestinal: Negative for vomiting, diarrhea and constipation.  POSITIVE for abdominal pain (contractions) Genitourinary: Negative for dysuria and urgency Musculoskeletal: Negative for back pain, joint pain, myalgias  Neurological: Negative for dizziness and headaches      Blood pressure 107/68, pulse 75, temperature 97.6 F (36.4 C), temperature source Oral, resp. rate 17, height 5' 0.05" (1.525 m), weight 64 kg, last menstrual period 04/24/2018. General appearance: alert, cooperative, appears stated age and no distress Lungs: normal respiratory effort Heart: regular rate and rhythm Abdomen: soft, non-tender; bowel sounds normal Extremities: Homans sign is negative, no sign of DVT DTR's 2+ Presentation: cephalic Fetal monitoring  Baseline: 145 bpm, Variability: Good {> 6 bpm), Accelerations: Reactive and Decelerations: Absent Uterine activity   Frequency: Every 5-10 minutes, irregular Dilation: 3.5 Effacement (%): 70 Station: -2 Exam by:: stone rnc   Prenatal labs: ABO, Rh: --/--/O POS (09/07 1250) Antibody: NEG (09/07 1250) Rubella: Immune (03/02 0000) RPR: Non Reactive (07/10 0846)  HBsAg: Negative (03/02 0000)  HIV: Non Reactive (07/10 0846)  GBS: Negative (08/27 0000)  1 hr Glucola 96 Genetic screening  Negative for anomaly  Anatomy US 09/22/2018  Prenatal Transfer Tool  Maternal Diabetes: No Genetic Screening: Normal Maternal Ultrasounds/Referrals: Normal Fetal Ultrasounds or other Referrals:  None Maternal Substance Abuse:  No Significant Maternal Medications:  None Significant Maternal Lab Results: None     Results for orders placed or performed during the hospital encounter of 01/11/19 (from the past 24 hour(s))  Fern Test   Collection Time: 01/11/19 11:50 AM  Result Value Ref Range   POCT Fern Test Positive = ruptured amniotic membanes   SARS Coronavirus 2 [facility] order, Performed in [place] hospital lab) Nasopharyngeal Nasopharyngeal Swab   Collection Time: 01/11/19 12:50 PM   Specimen: Nasopharyngeal Swab  Result Value Ref Range   SARS Coronavirus 2 NEGATIVE NEGATIVE  CBC   Collection Time: 01/11/19 12:50 PM  Result Value Ref Range   WBC 6.5 4.0 - 10.5 K/uL   RBC 3.92 3.87 - 5.11 MIL/uL   Hemoglobin 10.8 (L) 12.0 - 15.0 g/dL   HCT 33.2 (L) 36.0 - 46.0 %   MCV 84.7 80.0 - 100.0 fL   MCH 27.6 26.0 - 34.0 pg   MCHC 32.5 30.0 - 36.0 g/dL   RDW 12.9 11.5 - 15.5 %   Platelets 195 150 - 400 K/uL   nRBC 0.0 0.0 - 0.2 %  Type and screen [place]   Collection Time: 01/11/19 12:50 PM  Result Value Ref Range   ABO/RH(D) O POS    Antibody Screen NEG    Sample Expiration      01/14/2019,2359 Performed at [facility], [address] [place], [place] [address]     Assessment: Marie Bridges is a 38 y.o. [phone] with an IUP at [redacted]w[redacted]d presenting for PROM at 37  weeks. Irregular contractions noted  Plan: #Labor: augment with low-dose pitocin as pt is ruptured #Pain:  Per request, plans for epidural #FWB Cat 1, cont to monitor #ID: GBS: negative #MOF: breast  #MOC: Nexplanon #Circ: no   Marie Bridges 10/13/6787, 3:81 PM   I have seen and examined this patient and agree with the management plan.

## 2019-01-11 NOTE — Discharge Summary (Addendum)
Postpartum Discharge Summary      Patient Name: Marie Bridges DOB: [birth year] MRN: [id number]  Date of admission: 01/11/2019 Delivering Provider: Drinda Butts E   Date of discharge: 01/13/2019  Admitting diagnosis: Water broke Intrauterine pregnancy: [redacted]w[redacted]d    Secondary diagnosis:  Active Problems:   Indication for care in labor or delivery  Additional problems: none     Discharge diagnosis: Term Pregnancy Delivered                                                                                                Post partum procedures: NA  Augmentation: Pitocin  Complications: None  Hospital course:  Onset of Labor With Vaginal Delivery     38y.o. yo [phone] 36w3das admitted in Latent Labor on 01/11/2019. Patient had an uncomplicated labor course as follows:  Membrane Rupture Time/Date: 9:00 AM ,01/11/2019   Intrapartum Procedures: Episiotomy: None [1]                                         Lacerations:  None [1]  Patient had a delivery of a Viable infant. 01/11/2019  Information for the patient's newborn:  BeNikitia, Asbill0[[id number]]Delivery Method: Vaginal, Spontaneous(Filed from Delivery Summary)     Pateint had an uncomplicated postpartum course.  She is ambulating, tolerating a regular diet, passing flatus, and urinating well. Patient is discharged home in stable condition on 01/13/19.  Delivery time: 6:24 PM    Magnesium Sulfate received: No BMZ received: No Rhophylac:No MMR:No Transfusion:No  Physical exam  Vitals:   01/12/19 1141 01/12/19 1426 01/12/19 2109 01/13/19 0522  BP: 103/63 (!) 99/59 115/60 100/64  Pulse: 66 63 65 62  Resp: '16 16 15 17  '$ Temp: 98.1 F (36.7 C) 98.2 F (36.8 C) 98.4 F (36.9 C) 98.2 F (36.8 C)  TempSrc: Oral Oral Oral Oral  SpO2:   100% 100%  Weight:      Height:       General: alert, cooperative and no distress Lochia: appropriate Uterine Fundus: firm Incision: N/A DVT Evaluation: No evidence of DVT  seen on physical exam. No significant calf/ankle edema. Labs: Lab Results  Component Value Date   WBC 6.5 01/11/2019   HGB 10.8 (L) 01/11/2019   HCT 33.2 (L) 01/11/2019   MCV 84.7 01/11/2019   PLT 195 01/11/2019   CMP Latest Ref Rng & Units 08/05/2018  Glucose 65 - 99 mg/dL 72  BUN 6 - 20 mg/dL 7  Creatinine 0.57 - 1.00 mg/dL 0.43(L)  Sodium 134 - 144 mmol/L 139  Potassium 3.5 - 5.2 mmol/L 4.2  Chloride 96 - 106 mmol/L 103  CO2 20 - 29 mmol/L 21  Calcium 8.7 - 10.2 mg/dL 9.0    Discharge instruction: per After Visit Summary and "Baby and Me Booklet".  After visit meds:  Allergies as of 01/13/2019   No Known Allergies     Medication List    STOP taking these medications  aspirin 81 MG chewable tablet     TAKE these medications   docusate sodium 100 MG capsule Commonly known as: COLACE Take 1 capsule (100 mg total) by mouth 2 (two) times daily.   ibuprofen 600 MG tablet Commonly known as: ADVIL Take 1 tablet (600 mg total) by mouth every 6 (six) hours.   omeprazole 20 MG capsule Commonly known as: PRILOSEC Take 1 capsule (20 mg total) by mouth daily.   prenatal vitamin w/FE, FA 27-1 MG Tabs tablet Take 1 tablet by mouth daily at 12 noon.       Diet: routine diet  Activity: Advance as tolerated. Pelvic rest for 6 weeks.   Outpatient follow up:4 weeks Follow up Appt: Future Appointments  Date Time Provider [place]  01/13/2019  2:15 PM [place] 4 WH-MFCUS MFC-US   Follow up Visit: Follow-up Information    Department, [facility] Follow up.   Why: Call to schedule nexplanon placement Contact information: [place] [address] [phone]            Please schedule this patient for Postpartum visit in: 4 weeks with the following provider: Any provider Low risk pregnancy complicated by:  Delivery mode:  SVD Anticipated Birth Control:  Nexplanon - patient will schedule at [place] visit:  no      Newborn Data: Live born female  Birth Weight:  84g  APGAR:08/9  Newborn Delivery   Birth date/time: 01/11/2019 18:24:00 Delivery type: Vaginal, Spontaneous      Baby Feeding: Breast Disposition:home with mother   01/13/2019 Nikki Dom Driver, MD   Provider attestation I have seen and examined this patient and agree with above documentation in the resident's note.   [address] Cassandria Bridges is a 38 y.o. [phone] s/p SVD.  Pain is well controlled. Plan for birth control is nexplanon to be placed at [facility]. Method of Feeding: breast  PE:  Gen: well appearing Heart: reg rate Lungs: normal WOB Fundus firm Ext: no pain, no edema  Recent Labs    01/11/19 1250  HGB 10.8*  HCT 33.2*    Assessment S/p SVD PPD # 2  Plan: - discharge home - postpartum care discussed - f/u in office in 6 weeks for postpartum visit -call [facility] to schedule nexplanon placement   Jorje Guild, NP 11:08 AM

## 2019-01-11 NOTE — Anesthesia Procedure Notes (Signed)
Epidural Patient location during procedure: OB Start time: 01/11/2019 5:15 PM End time: 01/11/2019 5:26 PM  Staffing Anesthesiologist: Lidia Collum, MD Performed: anesthesiologist   Preanesthetic Checklist Completed: patient identified, pre-op evaluation, timeout performed, IV checked, risks and benefits discussed and monitors and equipment checked  Epidural Patient position: sitting Prep: DuraPrep Patient monitoring: heart rate, continuous pulse ox and blood pressure Approach: midline Location: L3-L4 Injection technique: LOR air  Needle:  Needle type: Tuohy  Needle gauge: 17 G Needle length: 9 cm Needle insertion depth: 5 cm Catheter type: closed end flexible Catheter size: 19 Gauge Catheter at skin depth: 10 cm Test dose: negative  Assessment Events: blood not aspirated, injection not painful, no injection resistance, negative IV test and no paresthesia  Additional Notes Reason for block:procedure for pain

## 2019-01-11 NOTE — Progress Notes (Signed)
01/11/2019 - 5:10 PM  38 y.o. [id number] [redacted]w[redacted]d (LMP). Pregnancy complicated by nothing  Patient Active Problem List   Diagnosis Date Noted  . Indication for care in labor or delivery 01/11/2019  . Supervision of other normal pregnancy, antepartum 08/05/2018  . Language barrier 08/05/2018  . AMA (advanced maternal age) multigravida 35+ 07/20/2018    Ms. [facility] is admitted for ROM/early labor   Subjective:  Ctx getting stronger Objective:   Vitals:   01/11/19 1408 01/11/19 1455 01/11/19 1529 01/11/19 1701  BP: 107/68 (!) 100/48 (!) 90/48   Pulse: 75 68 71   Resp:  18 16 17   Temp:  99.4 F (37.4 C)  98.4 F (36.9 C)  TempSrc:  Oral  Oral  Weight:      Height:        Current Vital Signs 24h Vital Sign Ranges  T 98.4 F (36.9 C) Temp  Avg: 98.2 F (36.8 C)  Min: 97.5 F (36.4 C)  Max: 99.4 F (37.4 C)  BP (!) 90/48 BP  Min: 90/48  Max: 118/76  HR 71 Pulse  Avg: 72.4  Min: 68  Max: 75  RR 17 Resp  Avg: 17.2  Min: 16  Max: 18  SaO2     No data recorded       24 Hour I/O Current Shift I/O  Time Ins Outs No intake/output data recorded. No intake/output data recorded.   FHR: 145 Toco: 2-3 SVE: 5/70/-2 Pitocin at 8 mu/min  Labs:  Recent Labs  Lab 01/11/19 1250  WBC 6.5  HGB 10.8*  HCT 33.2*  PLT 195   No results for input(s): NA, K, CL, CO2, BUN, CREATININE, CALCIUM, PROT, BILITOT, ALKPHOS, ALT, AST, GLUCOSE in the last 168 hours.  Invalid input(s): LABALBU  Medications Current Facility-Administered Medications  Medication Dose Route Frequency Provider Last Rate Last Dose  . acetaminophen (TYLENOL) tablet 650 mg  650 mg Oral Q4H PRN Christin Fudge, CNM      . diphenhydrAMINE (BENADRYL) injection 12.5 mg  12.5 mg Intravenous Q15 min PRN Lidia Collum, MD      . ePHEDrine injection 10 mg  10 mg Intravenous PRN Lidia Collum, MD      . ePHEDrine injection 10 mg  10 mg Intravenous PRN Lidia Collum, MD      . fentaNYL (SUBLIMAZE)  injection 50-100 mcg  50-100 mcg Intravenous Q1H PRN Christin Fudge, CNM   100 mcg at 01/11/19 1646  . fentaNYL 2 mcg/mL w/ bupivacaine 0.125% in NS 250 mL epidural infusion ([facility]-ANES)  12 mL/hr Epidural Continuous PRN Lidia Collum, MD      . hydrOXYzine (ATARAX/VISTARIL) tablet 50 mg  50 mg Oral Q6H PRN Christin Fudge, CNM      . lactated ringers infusion 500 mL  500 mL Intravenous Once Lidia Collum, MD      . lactated ringers infusion 500-1,000 mL  500-1,000 mL Intravenous PRN Christin Fudge, CNM      . lactated ringers infusion   Intravenous Continuous Christin Fudge, CNM 125 mL/hr at 01/11/19 1327    . lidocaine (PF) (XYLOCAINE) 1 % injection 30 mL  30 mL Subcutaneous PRN Christin Fudge, CNM      . ondansetron (ZOFRAN) injection 4 mg  4 mg Intravenous Q6H PRN Christin Fudge, CNM      . oxyCODONE-acetaminophen (PERCOCET/ROXICET) 5-325 MG per tablet 1 tablet  1 tablet Oral Q4H PRN Christin Fudge, CNM      . oxyCODONE-acetaminophen (  PERCOCET/ROXICET) 5-325 MG per tablet 2 tablet  2 tablet Oral Q4H PRN Jacklyn Shellresenzo-Dishmon, Anavictoria Wilk, CNM      . oxytocin (PITOCIN) IV BOLUS FROM BAG  500 mL Intravenous Once Jacklyn Shellresenzo-Dishmon, Evon Lopezperez, CNM      . oxytocin (PITOCIN) IV infusion 40 units in NS 1000 mL - Premix  2.5 Units/hr Intravenous Continuous Jacklyn Shellresenzo-Dishmon, Aphrodite Harpenau, CNM      . oxytocin (PITOCIN) IV infusion 40 units in NS 1000 mL - Premix  1-40 milli-units/min Intravenous Titrated Driver, Trey SailorsPaige E, MD 12 mL/hr at 01/11/19 1631 8 milli-units/min at 01/11/19 1631  . PHENYLephrine 40 mcg/ml in normal saline Adult IV Push Syringe  80 mcg Intravenous PRN Lucretia KernWitman, Carolyn E, MD      . PHENYLephrine 40 mcg/ml in normal saline Adult IV Push Syringe  80 mcg Intravenous PRN Lucretia KernWitman, Carolyn E, MD      . sodium citrate-citric acid (ORACIT) solution 30 mL  30 mL Oral Q2H PRN Jacklyn Shellresenzo-Dishmon, Carys Malina, CNM      . sodium phosphate  (FLEET) 7-19 GM/118ML enema 1 enema  1 enema Rectal PRN Jacklyn Shellresenzo-Dishmon, Ronniesha Seibold, CNM      . terbutaline (BRETHINE) injection 0.25 mg  0.25 mg Subcutaneous Once PRN Driver, Trey SailorsPaige E, MD        Assessment & Plan:  ROM/early labor *IUP: FHT Cat 1 *GBS: n/a *Analgesia: none

## 2019-01-11 NOTE — MAU Note (Signed)
Pt reports water broke around 9am. Clear fluid mild contractions and good fetal movement.

## 2019-01-11 NOTE — Anesthesia Preprocedure Evaluation (Signed)
Anesthesia Evaluation  Patient identified by MRN, date of birth, ID band Patient awake    Reviewed: Allergy & Precautions, H&P , NPO status , Patient's Chart, lab work & pertinent test results  History of Anesthesia Complications Negative for: history of anesthetic complications  Airway Mallampati: II  TM Distance: >3 FB Neck ROM: full    Dental no notable dental hx.    Pulmonary neg pulmonary ROS,    Pulmonary exam normal        Cardiovascular hypertension, Normal cardiovascular exam Rhythm:regular Rate:Normal     Neuro/Psych negative neurological ROS  negative psych ROS   GI/Hepatic Neg liver ROS, PUD,   Endo/Other  negative endocrine ROS  Renal/GU negative Renal ROS  negative genitourinary   Musculoskeletal   Abdominal   Peds  Hematology negative hematology ROS (+)   Anesthesia Other Findings   Reproductive/Obstetrics (+) Pregnancy                             Anesthesia Physical Anesthesia Plan  ASA: II  Anesthesia Plan: Epidural   Post-op Pain Management:    Induction:   PONV Risk Score and Plan:   Airway Management Planned:   Additional Equipment:   Intra-op Plan:   Post-operative Plan:   Informed Consent: I have reviewed the patients History and Physical, chart, labs and discussed the procedure including the risks, benefits and alternatives for the proposed anesthesia with the patient or authorized representative who has indicated his/her understanding and acceptance.       Plan Discussed with:   Anesthesia Plan Comments:         Anesthesia Quick Evaluation

## 2019-01-12 LAB — RPR: RPR Ser Ql: NONREACTIVE

## 2019-01-12 MED ORDER — INFLUENZA VAC SPLIT QUAD 0.5 ML IM SUSY
0.5000 mL | PREFILLED_SYRINGE | INTRAMUSCULAR | Status: AC
  Administered 2019-01-13: 0.5 mL via INTRAMUSCULAR
  Filled 2019-01-12 (×3): qty 0.5

## 2019-01-12 MED ORDER — IBUPROFEN 600 MG PO TABS: 600.0000 mg | ORAL_TABLET | Freq: Four times a day (QID) | ORAL | 0 refills | Status: DC

## 2019-01-12 NOTE — Progress Notes (Signed)
Pt wants flu vaccine at discharge

## 2019-01-12 NOTE — Progress Notes (Signed)
Post Partum Day 1 Subjective: no complaints, up ad lib, voiding and tolerating PO, small lochia, plans to breastfeed, plans to bottle feed, outpatient nexplanon  Objective: Blood pressure (!) 109/55, pulse 64, temperature 98.9 F (37.2 C), temperature source Oral, resp. rate 18, height 5' 0.05" (1.525 m), weight 64 kg, last menstrual period 04/24/2018, SpO2 99 %, unknown if currently breastfeeding.  Physical Exam:  General: alert, cooperative and no distress Lochia:normal flow Chest: CTAB Heart: RRR no m/r/g Abdomen: +BS, soft, nontender,  Uterine Fundus: firm DVT Evaluation: No evidence of DVT seen on physical exam. Extremities: no edema  Recent Labs    01/11/19 1250  HGB 10.8*  HCT 33.2*    Assessment/Plan: Plan for discharge tomorrow, possible dc later today if infant is dc'd by peds, 24 hours is 1800 this evening. Overall is doing well, no complaints.    LOS: 1 day   Marie Bridges 06/11/6387, 3:73 AM

## 2019-01-12 NOTE — Lactation Note (Signed)
This note was copied from a baby's chart. Lactation Consultation Note Used [employer] interpreter used [phone] Levada Dy. Baby 66 hrs old. Mom's 4th child. Mom has 36 yr old that she BF for 2 yrs, 36 yr old for 1 1/2 yrs, 36 yr old for 1 1/2 yrs. This baby was a surprise. Mom has everted nipples. Mom demonstrated hand expression by squeezing nipple. LC demonstrated proper hand expression showing mom easily expressed colostrum. Breast slightly tender. Newborn behavior, feeding habits, STS, I&O, breast massage, supply and demand discussed. Mentioned to mom since baby is less than 6 lbs mom needs to pump and supplement. Gave mom LPI information sheet, then to FOB about supplementing. Encouraged mom pumping. Mom agreed. Asked RN to have set up today, close to end of shift. Mom will need to be reviewed supplementing again and pumping w/interpreter. Mom has [facility] w/[place]. Lactation brochure given in [place].  Patient Name: Marie Bridges [id number] Date: 01/12/2019 Reason for consult: Initial assessment;Infant < 6lbs;Early term 37-38.6wks   Maternal Data Has patient been taught Hand Expression?: Yes Does the patient have breastfeeding experience prior to this delivery?: Yes  Feeding Feeding Type: Breast Fed  LATCH Score       Type of Nipple: Everted at rest and after stimulation  Comfort (Breast/Nipple): Soft / non-tender        Interventions Interventions: Breast feeding basics reviewed;Breast massage;Hand express  Lactation Tools Discussed/Used WIC Program: Yes   Consult Status Consult Status: Follow-up Date: 01/12/19 Follow-up type: In-patient    Theodoro Kalata 01/12/2019, 6:32 AM

## 2019-01-12 NOTE — Anesthesia Postprocedure Evaluation (Signed)
Anesthesia Post Note  Patient: [employer]  Procedure(s) Performed: AN AD HOC LABOR EPIDURAL     Patient location during evaluation: Mother Baby Anesthesia Type: Epidural Level of consciousness: awake and alert and oriented Pain management: satisfactory to patient Vital Signs Assessment: post-procedure vital signs reviewed and stable Respiratory status: spontaneous breathing and nonlabored ventilation Cardiovascular status: stable Postop Assessment: no headache, no backache, no signs of nausea or vomiting, adequate PO intake, patient able to bend at knees and able to ambulate (patient up walking) Anesthetic complications: no    Last Vitals:  Vitals:   01/12/19 1141 01/12/19 1426  BP: 103/63 (!) 99/59  Pulse: 66 63  Resp: 16 16  Temp: 36.7 C 36.8 C  SpO2:      Last Pain:  Vitals:   01/12/19 1426  TempSrc: Oral  PainSc:    Pain Goal:                   Willa Rough

## 2019-01-13 ENCOUNTER — Ambulatory Visit (HOSPITAL_COMMUNITY): Payer: Self-pay | Attending: Obstetrics and Gynecology

## 2019-01-13 ENCOUNTER — Encounter: Payer: Self-pay | Admitting: Obstetrics & Gynecology

## 2019-01-13 DIAGNOSIS — Z3A37 37 weeks gestation of pregnancy: Secondary | ICD-10-CM

## 2019-01-13 DIAGNOSIS — O4202 Full-term premature rupture of membranes, onset of labor within 24 hours of rupture: Secondary | ICD-10-CM

## 2019-01-13 MED ORDER — DOCUSATE SODIUM 100 MG PO CAPS: 100.0000 mg | ORAL_CAPSULE | Freq: Two times a day (BID) | ORAL | 0 refills | Status: DC

## 2019-01-13 NOTE — Discharge Instructions (Signed)
Parto vaginal, cuidados de puerperio Postpartum Care After Vaginal Delivery Lea esta informacin sobre cmo cuidarse desde el momento en que nazca su beb y hasta 6 a 12 semanas despus del parto (perodo del posparto). El mdico tambin podr darle instrucciones ms especficas. Comunquese con su mdico si tiene problemas o preguntas. Siga estas indicaciones en su casa: Hemorragia vaginal  Es normal tener un poco de hemorragia vaginal (loquios) despus del parto. Use un apsito sanitario para el sangrado vaginal y secrecin. ? Durante la primera semana despus del parto, la cantidad y el aspecto de los loquios a menudo es similar a las del perodo menstrual. ? Durante las siguientes semanas disminuir gradualmente hasta convertirse en una secrecin seca amarronada o amarillenta. ? En la mayora de las mujeres, los loquios se detienen completamente entre 4 a 6semanas despus del parto. Los sangrados vaginales pueden variar de mujer a mujer.  Cambie los apsitos sanitarios con frecuencia. Observe si hay cambios en el flujo, como: ? Un aumento repentino en el volumen. ? Cambio en el color. ? Cogulos sanguneos grandes.  Si expulsa un cogulo de sangre por la vagina, gurdelo y llame al mdico para informrselo. No deseche los cogulos de sangre por el inodoro antes de hablar con su mdico.  No use tampones ni se haga duchas vaginales hasta que el mdico la autorice.  Si no est amamantando, volver a tener su perodo entre 6 y 8 semanas despus del parto. Si solamente alimenta al beb con leche materna (lactancia materna exclusiva), podra no volver a tener su perodo hasta que deje de amamantar. Cuidados perineales  Mantenga la zona entre la vagina y el ano (perineo) limpia y seca, como se lo haya indicado el mdico. Utilice apsitos o aerosoles analgsicos y cremas, como se lo hayan indicado.  Si le hicieron un corte en el perineo (episiotoma) o tuvo un desgarro en la vagina, controle la  zona para detectar signos de infeccin hasta que sane. Est atenta a los siguientes signos: ? Aumento del enrojecimiento, la hinchazn o el dolor. ? Presenta lquido o sangre que supura del corte o desgarro. ? Calor. ? Pus o mal olor.  Es posible que le den una botella rociadora para que use en lugar de limpiarse el rea con papel higinico despus de usar el bao. Cuando comience a sanar, podr usar la botella rociadora antes de secarse. Asegrese de secarse suavemente.  Para aliviar el dolor causado por una episiotoma, un desgarro en la vagina o venas hinchadas en el ano (hemorroides), trate de tomar un bao de asiento tibio 2 o 3 veces por da. Un bao de asiento es un bao de agua tibia que se toma mientras se est sentado. El agua solo debe llegar hasta las caderas y cubrir las nalgas. Cuidado de las mamas  En los primeros das despus del parto, las mamas pueden sentirse pesadas, llenas e incmodas (congestin mamaria). Tambin puede escaparse leche de sus senos. El mdico puede sugerirle mtodos para aliviar este malestar. La congestin mamaria debera desaparecer al cabo de unos das.  Si est amamantando: ? Use un sostn que sujete y ajuste bien sus pechos. ? Mantenga los pezones secos y limpios. Aplquese cremas y ungentos, como se lo haya indicado el mdico. ? Es posible que deba usar discos de algodn en el sostn para absorber la leche que se filtre de sus senos. ? Puede tener contracciones uterinas cada vez que amamante durante varias semanas despus del parto. Las contracciones uterinas ayudan al tero a   regresar a su tamao habitual. ? Si tiene algn problema con la lactancia materna, colabore con el mdico o un asesor en lactancia.  Si no est amamantando: ? Evite tocarse mucho las mamas. Al hacerlo, podran producir ms leche. ? Use un sostn que le proporcione el ajuste correcto y compresas fras para reducir la hinchazn. ? No extraiga (saque) leche materna. Esto har que  produzca ms leche. Intimidad y sexualidad  Pregntele al mdico cundo puede retomar la actividad sexual. Esto puede depender de lo siguiente: ? Su riesgo de sufrir infecciones. ? La rapidez con la que est sanando. ? Su comodidad y deseo de retomar la actividad sexual.  Despus del parto, puede quedar embarazada incluso si no ha tenido todava su perodo. Si lo desea, hable con el mdico acerca de los mtodos de control de la natalidad (mtodos anticonceptivos). Medicamentos  Tome los medicamentos de venta libre y los recetados solamente como se lo haya indicado el mdico.  Si le recetaron un antibitico, tmelo como se lo haya indicado el mdico. No deje de tomar el antibitico aunque comience a sentirse mejor. Actividad  Retome sus actividades normales de a poco como se lo haya indicado el mdico. Pregntele al mdico qu actividades son seguras para usted.  Descanse todo lo que pueda. Trate de descansar o tomar una siesta mientras el beb duerme. Comida y bebida   Beba suficiente lquido como para mantener la orina de color amarillo plido.  Coma alimentos ricos en fibras todos los das. Estos pueden ayudarla a prevenir o aliviar el estreimiento. Los alimentos ricos en fibras incluyen, entre otros: ? Panes y cereales integrales. ? Arroz integral. ? Frijoles. ? Frutas y verduras frescas.  No intente perder de peso rpidamente reduciendo el consumo de caloras.  Tome sus vitaminas prenatales hasta la visita de seguimiento de posparto o hasta que su mdico le indique que puede dejar de tomarlas. Estilo de vida  No consuma ningn producto que contenga nicotina o tabaco, como cigarrillos y cigarrillos electrnicos. Si necesita ayuda para dejar de fumar, consulte al mdico.  No beba alcohol, especialmente si est amamantando. Instrucciones generales  Concurra a todas las visitas de seguimiento para usted y el beb, como se lo haya indicado el mdico. La mayora de las mujeres  visita al mdico para un seguimiento de posparto dentro de las primeras 3 a 6 semanas despus del parto. Comunquese con un mdico si:  Se siente incapaz de controlar los cambios que implica tener un hijo y esos sentimientos no desaparecen.  Siente tristeza o preocupacin de forma inusual.  Las mamas se ponen rojas, le duelen o se endurecen.  Tiene fiebre.  Tiene dificultad para retener la orina o para impedir que la orina se escape.  Tiene poco inters o falta de inters en actividades que solan gustarle.  No ha amamantado nada y no ha tenido un perodo menstrual durante 12 semanas despus del parto.  Dej de amamantar al beb y no ha tenido su perodo menstrual durante 12 semanas despus de dejar de amamantar.  Tiene preguntas sobre su cuidado y el del beb.  Elimina un cogulo de sangre grande por la vagina. Solicite ayuda de inmediato si:  Siente dolor en el pecho.  Tiene dificultad para respirar.  Tiene un dolor repentino e intenso en la pierna.  Tiene dolor intenso o clicos en el la parte inferior del abdomen.  Tiene una hemorragia tan intensa de la vagina que empapa ms de un apsito en una hora. El sangrado   no debe ser ms abundante que el perodo ms intenso que haya tenido.  Dolor de cabeza intenso.  Se desmaya.  Tiene visin borrosa o manchas en la vista.  Tiene secrecin vaginal con mal olor.  Tiene pensamientos acerca de lastimarse a usted misma o a su beb. Si alguna vez siente que puede lastimarse a usted misma o a otras personas, o tiene pensamientos de poner fin a su vida, busque ayuda de inmediato. Puede dirigirse al departamento de emergencias ms cercano o llamar a:  El servicio de emergencias de su localidad (911 en [place]  Una lnea de asistencia al suicida y atencin en crisis, como la Lnea Nacional de Prevencin del Suicidio (National Suicide Prevention Lifeline), al 1-800-273-8255. Est disponible las 24 horas del da. Resumen  El  perodo de tiempo justo despus el parto y hasta 6 a 12 semanas despus del parto se denomina perodo posparto.  Retome sus actividades normales de a poco como se lo haya indicado el mdico.  Concurra a todas las visitas de seguimiento para usted y el beb, como se lo haya indicado el mdico. Esta informacin no tiene como fin reemplazar el consejo del mdico. Asegrese de hacerle al mdico cualquier pregunta que tenga. Document Released: 02/17/2007 Document Revised: 08/02/2017 Document Reviewed: 04/13/2017 Elsevier Patient Education  2020 Elsevier Inc.  

## 2019-01-13 NOTE — Lactation Note (Signed)
This note was copied from a baby's chart. Lactation Consultation Note  Patient Name: Marie Bridges [id number] Date: 01/13/2019 Reason for consult: Follow-up assessment  Arbie Cookey, in-house interpreter, was present for both visits with patient. Mom says the infant is latching well & there are swallows. Mom is offering a bottle after infant feeds at the breast. Infant only lost 35 g over the last 24 hours.   Mom's breasts are soft & nipples are atraumatic. She has no breast complaints.  Mom was shown how to assemble & use hand pump (single- & double-mode) that was included in pump kit. I suggested that she consider pumping when infant receives formula.   Matthias Hughs [facility] 01/13/2019, 8:18 AM

## 2019-02-17 ENCOUNTER — Ambulatory Visit: Payer: Self-pay | Admitting: Obstetrics and Gynecology

## 2019-03-03 ENCOUNTER — Ambulatory Visit: Payer: Self-pay | Admitting: Obstetrics and Gynecology

## 2019-03-03 ENCOUNTER — Encounter: Payer: Self-pay | Admitting: Obstetrics and Gynecology

## 2019-04-08 ENCOUNTER — Encounter: Payer: Self-pay | Admitting: Obstetrics & Gynecology

## 2019-04-08 ENCOUNTER — Ambulatory Visit (INDEPENDENT_AMBULATORY_CARE_PROVIDER_SITE_OTHER): Payer: Self-pay | Admitting: Obstetrics & Gynecology

## 2019-04-08 ENCOUNTER — Other Ambulatory Visit: Payer: Self-pay

## 2019-04-08 DIAGNOSIS — Z1389 Encounter for screening for other disorder: Secondary | ICD-10-CM

## 2019-04-08 NOTE — Progress Notes (Signed)
TELEHEALTH VIRTUAL POSTPARTUM VISIT ENCOUNTER NOTE  I connected with@ on 04/08/19 at  3:35 PM EST by telephone at home and verified that I am speaking with the correct person using two identifiers. Patient is Spanish-speaking only, Spanish interpreter present for this encounter. Unable to do virtual encounter secondary to technical difficulties.   I discussed the limitations, risks, security and privacy concerns of performing an evaluation and management service by telephone and the availability of in person appointments. I also discussed with the patient that there may be a patient responsible charge related to this service. The patient expressed understanding and agreed to proceed.  Appointment Date: 04/08/2019  OBGYN Clinic: [facility]  Chief Complaint:  Postpartum Visit  History of Present Illness: Kemper Hochman is a 38 y.o. Hispanic [id number]'s last menstrual period was 03/30/2019 (within days).), seen for the above chief complaint.   She is s/p normal spontaneous vaginal delivery on 01/11/2019 at 37 4/7 weeks; she was discharged to home on PPD#2. Pregnancy complicated by AMA. Baby is doing well.  No complaints.  Vaginal bleeding or discharge: No  Mode of feeding infant: Breast and Formula Intercourse: Yes , no problems Contraception: condoms PP depression s/s: No .  Any bowel or bladder issues: No  Pap smear: no abnormalities (date: 07/06/2018)  Review of Systems: Negative. Her 12 point review of systems is negative or as noted in the History of Present Illness.  There are no active problems to display for this patient.   Medications [facility] had no medications administered during this visit. Current Outpatient Medications  Medication Sig Dispense Refill  . prenatal vitamin w/FE, FA (PRENATAL 1 + 1) 27-1 MG TABS tablet Take 1 tablet by mouth daily at 12 noon.     No current facility-administered medications for this visit.     Allergies Patient has  no known allergies.  Physical Exam:  General:  Alert, oriented and cooperative.   Mental Status: Normal mood and affect perceived. Normal judgment and thought content.  Rest of physical exam deferred due to type of encounter  PP Depression Screening:    Edinburgh Postnatal Depression Scale Screening Tool 01/13/2019 01/12/2019  I have been able to laugh and see the funny side of things. 0 0  I have looked forward with enjoyment to things. 0 0  I have blamed myself unnecessarily when things went wrong. 0 0  I have been anxious or worried for no good reason. 0 0  I have felt scared or panicky for no good reason. 0 0  Things have been getting on top of me. 1 0  I have been so unhappy that I have had difficulty sleeping. 0 0  I have felt sad or miserable. 1 0  I have been so unhappy that I have been crying. 0 0  The thought of harming myself has occurred to me. 0 0  Edinburgh Postnatal Depression Scale Total 2 0     Assessment:Patient is a 38 y.o. [id number] who is 12 weeks postpartum from a normal spontaneous vaginal delivery.  She is doing well.   Plan: Postpartum care following vaginal delivery Stable, no issues. Desires Nexplanon, recommended that she get [facility].  I discussed the assessment and treatment plan with the patient. The patient was provided an opportunity to ask questions and all were answered. The patient agreed with the plan and demonstrated an understanding of the instructions.   The patient was advised to call back or seek an in-person  evaluation/go to the ED for any concerning postpartum symptoms.  I provided 15 minutes of non-face-to-face time during this encounter.   Jaynie Collins, MD [facility] [employer], [facility]

## 2019-06-05 ENCOUNTER — Other Ambulatory Visit: Payer: Self-pay

## 2019-06-05 ENCOUNTER — Encounter (HOSPITAL_COMMUNITY): Payer: Self-pay | Admitting: *Deleted

## 2019-06-05 ENCOUNTER — Emergency Department (HOSPITAL_COMMUNITY)
Admission: EM | Admit: 2019-06-05 | Discharge: 2019-06-05 | Disposition: A | Discharge status: Home / Self Care | Payer: Self-pay | Attending: Emergency Medicine | Admitting: Emergency Medicine

## 2019-06-05 DIAGNOSIS — R200 Anesthesia of skin: Secondary | ICD-10-CM | POA: Insufficient documentation

## 2019-06-05 DIAGNOSIS — R519 Headache, unspecified: Secondary | ICD-10-CM | POA: Insufficient documentation

## 2019-06-05 MED ORDER — HYDROCODONE-ACETAMINOPHEN 5-325 MG PO TABS
1.0000 | ORAL_TABLET | Freq: Once | ORAL | Status: AC
  Administered 2019-06-05: 1 via ORAL
  Filled 2019-06-05: qty 1

## 2019-06-05 MED ORDER — ACYCLOVIR 800 MG PO TABS: 800.0000 mg | ORAL_TABLET | Freq: Every day | ORAL | 0 refills | Status: DC

## 2019-06-05 MED ORDER — ACYCLOVIR 800 MG PO TABS: 800.0000 mg | ORAL_TABLET | Freq: Every day | ORAL | 0 refills | Status: AC

## 2019-06-05 MED ORDER — PREDNISONE 10 MG PO TABS: ORAL_TABLET | ORAL | 0 refills | Status: AC

## 2019-06-05 MED ORDER — HYDROCODONE-ACETAMINOPHEN 5-325 MG PO TABS: 1.0000 | ORAL_TABLET | ORAL | 0 refills | Status: AC | PRN

## 2019-06-05 MED ORDER — PROPARACAINE HCL 0.5 % OP SOLN: 1.0000 [drp] | Freq: Once | OPHTHALMIC | Status: DC

## 2019-06-05 MED ORDER — FLUORESCEIN SODIUM 1 MG OP STRP: 1.0000 | ORAL_STRIP | Freq: Once | OPHTHALMIC | Status: DC

## 2019-06-05 NOTE — Discharge Instructions (Addendum)
Take the medications as prescribed. Return to the ED if symptoms worsen or for new concern.   While taking the Norco, please be aware the medication will cross into breast milk and may make your baby sleepy. Consider pumping and dumping and supplementing with formula if having to take Norco regularly.

## 2019-06-05 NOTE — ED Provider Notes (Addendum)
[facility] EMERGENCY DEPARTMENT Provider Note   CSN: [id number] Arrival date & time: 06/05/19  0325     History Chief Complaint  Patient presents with  . Facial Pain    Marie Bridges is a 39 y.o. female.  Patient to ED for left facial pain that started 5 days ago. It started slowly and has been progressive. No history of similar symptoms. She describes pain without weakness or facial drooping. No rash. No ear pain. Pain affects the left scalp and is throbbing type pain. No better with ibuprofen. There is some numbness of the left facial area. She reports her left eye is irritated and tears, but no purulent discharge, visual change or swelling. No fever, recent illness.   The history is provided by the patient. A language interpreter was used ([place]).       Past Medical History:  Diagnosis Date  . Bilateral ovarian cysts   . Pregnancy induced hypertension   . Preterm labor   . Stomach ulcer   . UTI (urinary tract infection)     There are no problems to display for this patient.   Past Surgical History:  Procedure Laterality Date  . NO PAST SURGERIES       OB History    Gravida  4   Para  4   Term  3   Preterm  1   AB  0   Living  4     SAB  0   TAB  0   Ectopic  0   Multiple  0   Live Births  4           No family history on file.  Social History   Tobacco Use  . Smoking status: Never Smoker  . Smokeless tobacco: Never Used  Substance Use Topics  . Alcohol use: Not Currently  . Drug use: Not Currently    Home Medications Prior to Admission medications   Medication Sig Start Date End Date Taking? Authorizing Provider  prenatal vitamin w/FE, FA (PRENATAL 1 + 1) 27-1 MG TABS tablet Take 1 tablet by mouth daily at 12 noon.    [provider]    Allergies    Patient has no known allergies.  Review of Systems   Review of Systems  Constitutional: Negative for fever.  HENT: Negative for ear  pain, sore throat and trouble swallowing.        See HPI.  Eyes: Positive for pain and redness. Negative for photophobia, discharge and visual disturbance.  Respiratory: Negative for shortness of breath.   Cardiovascular: Negative for chest pain.  Gastrointestinal: Negative for nausea.  Musculoskeletal: Negative for neck pain.  Skin: Negative for rash.  Neurological: Positive for headaches. Negative for light-headedness.    Physical Exam Updated Vital Signs BP (!) 124/93 (BP Location: Right Arm)   Pulse 82   Temp 98.3 F (36.8 C) (Oral)   Resp 16   SpO2 98%   Physical Exam Vitals and nursing note reviewed.  Constitutional:      General: She is not in acute distress. HENT:     Head: Atraumatic.     Comments: There is very mild swelling of the left temporal scalp. No rash or redness.     Left Ear: Tympanic membrane, ear canal and external ear normal.     Mouth/Throat:     Mouth: Mucous membranes are moist.     Pharynx: No posterior oropharyngeal erythema.     Comments:  Stable dentition without obvious abscess or cavity.  Eyes:     Comments: Left eye is erythematous. FROM. No conjunctival swelling or chemosis. Cornea clear. PERRL. No visualized corneal lesion or ulceration.   Pulmonary:     Effort: Pulmonary effort is normal.  Neurological:     Comments: Minimal left perioral weakness. There is a reported mild sensory discrepancy of the left cheek and chin. Eyebrows raise symmetrically.     ED Results / Procedures / Treatments   Labs (all labs ordered are listed, but only abnormal results are displayed) Labs Reviewed - No data to display  EKG None  Radiology No results found.  Procedures Procedures (including critical care time)  Medications Ordered in ED Medications - No data to display  ED Course  I have reviewed the triage vital signs and the nursing notes.  Pertinent labs & imaging results that were available during my care of the patient were reviewed by  me and considered in my medical decision making (see chart for details).    MDM Rules/Calculators/A&P                      Patient to ED with complaints as detailed in the HPI.   Symptoms and exam produce a differential of Bell's Palsy vs pre-rash shingles, favor shingles given pain, minimal scalp swelling.    Will start on Acyclovir, prednisone. Will provide pain relief. Refer to optho for re-evaluation if eye pain worsens. Encourage recheck with primary care for ongoing management as needed.   Patient informs me that she has to leave and can't wait for eye staining which was recommended. Will provide optho referral.    Final Clinical Impression(s) / ED Diagnoses Final diagnoses:  None   1. Left facial pain  Rx / DC Orders ED Discharge Orders    None       Charlann Lange, PA-C 06/05/19 Como, Delice Bison, DO 06/05/19 Leslie, Maryjo Ragon, PA-C 06/05/19 0619    Ward, Delice Bison, DO 06/05/19 [phone]

## 2019-06-05 NOTE — ED Triage Notes (Addendum)
Via Spanish interpreter. Pt says that she has "throbbing" on the left side of her head and face, feels like her face is swollen on the left side, left eye tearing up and some numbness on the left side of her face. Symptoms started Monday. She denies Fevers.

## 2020-05-03 IMAGING — US OBSTETRIC <14 WK ULTRASOUND
1 series · 15 of 28 positions shown · non-contrast
Comparison: None.

CLINICAL DATA: Abdominal pain

EXAM:
OBSTETRIC <14 WK ULTRASOUND
TECHNIQUE: Transabdominal ultrasound was performed for evaluation of the
gestation as well as the maternal uterus and adnexal regions.

[Series 1: obstetric <14 wk ultrasound · 15 of 28 slices shown]
[im 1/28]
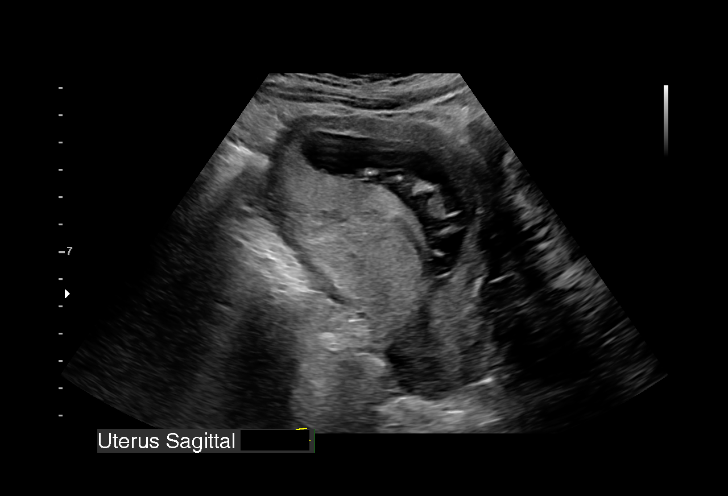
[im 3/28]
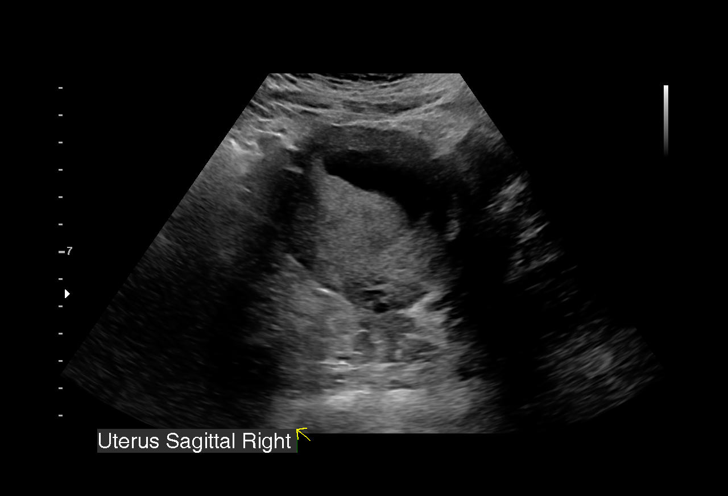
[im 5/28]
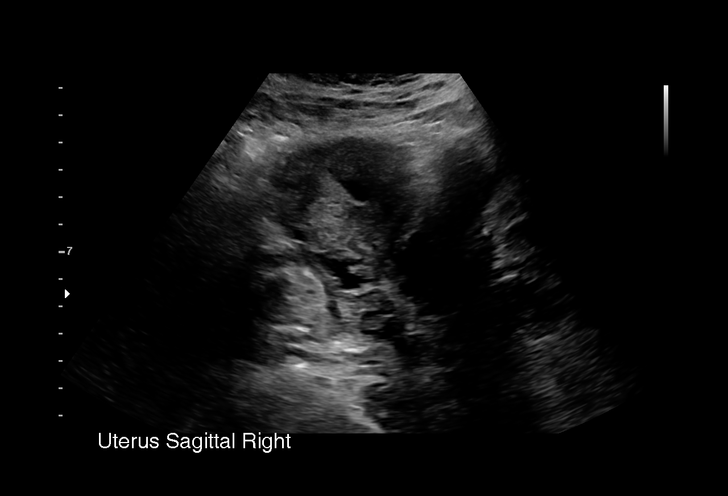
[im 7/28]
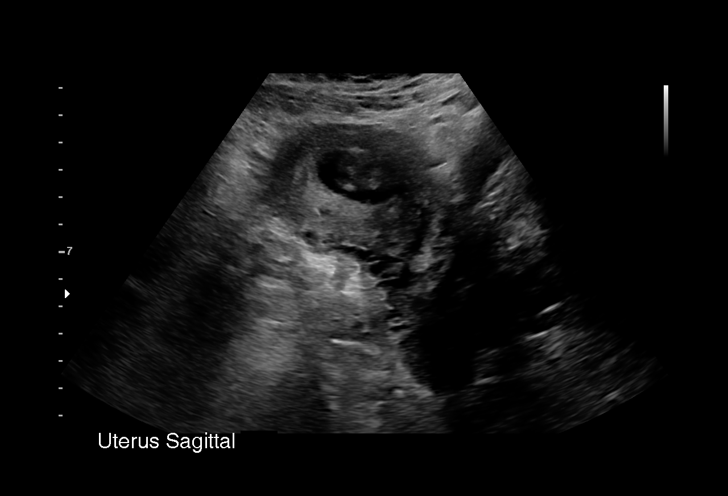
[im 9/28]
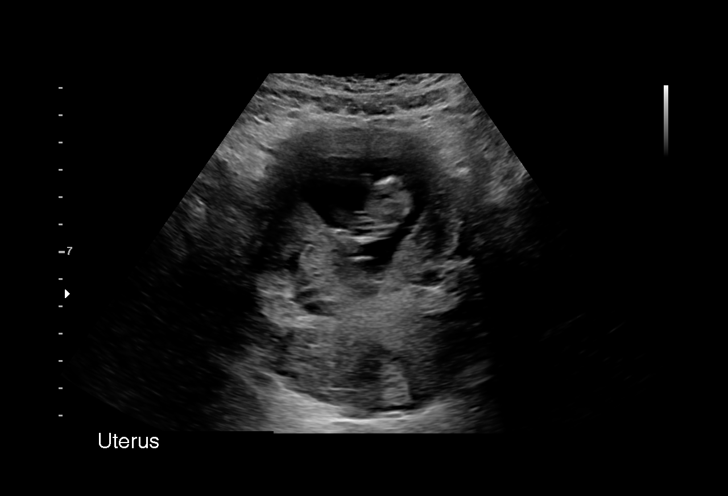
[im 11/28]
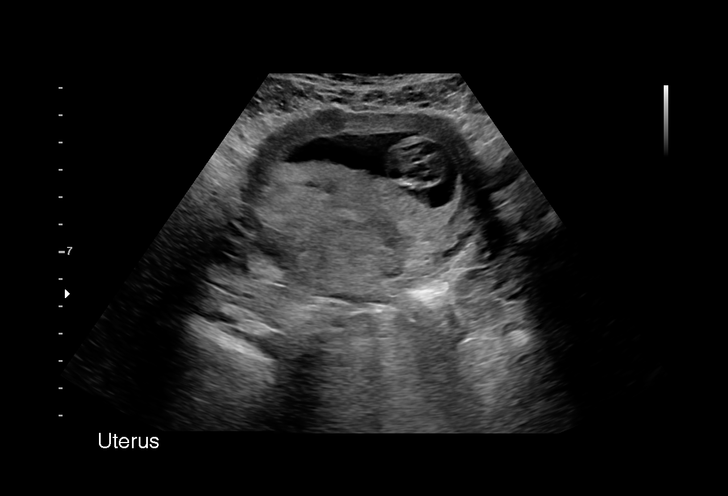
[im 13/28]
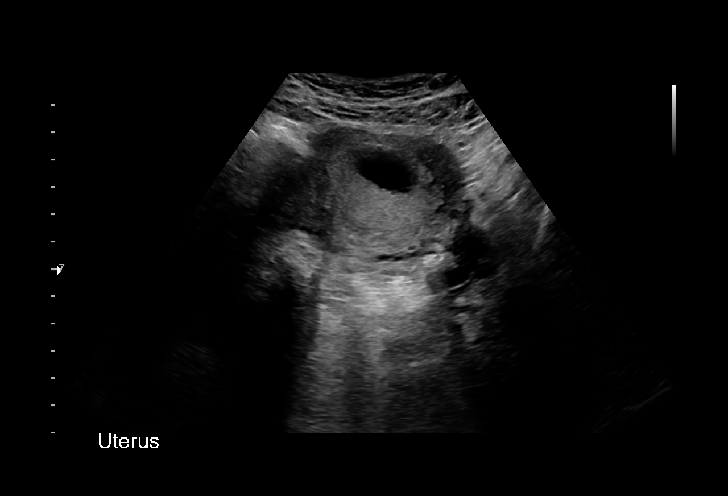
[im 15/28]
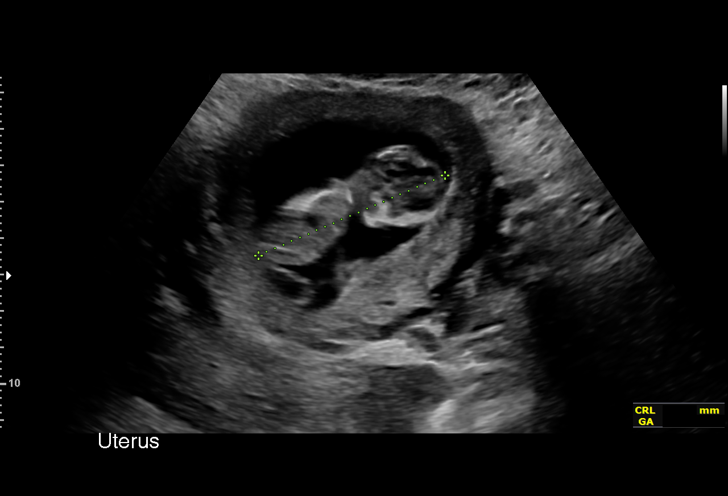
[im 16/28]
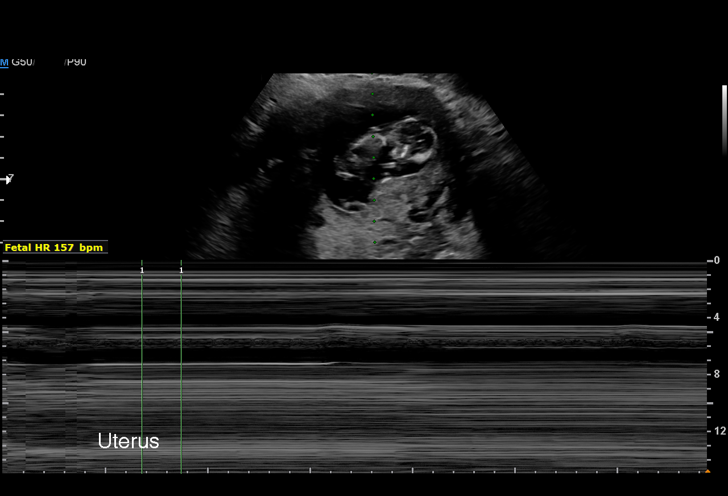
[im 18/28]
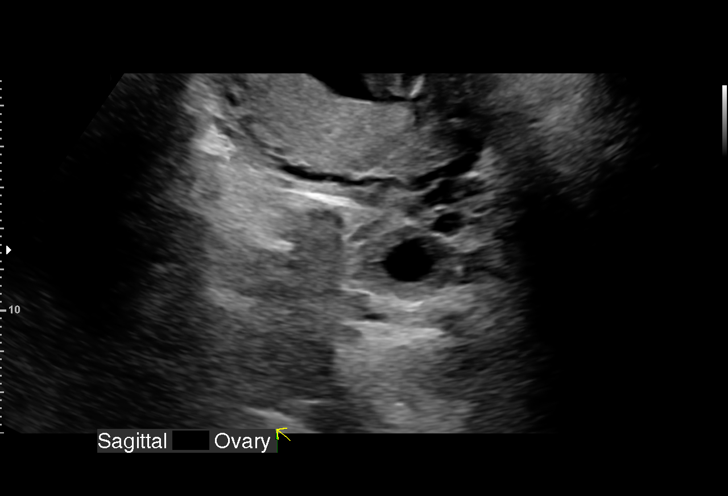
[im 20/28]
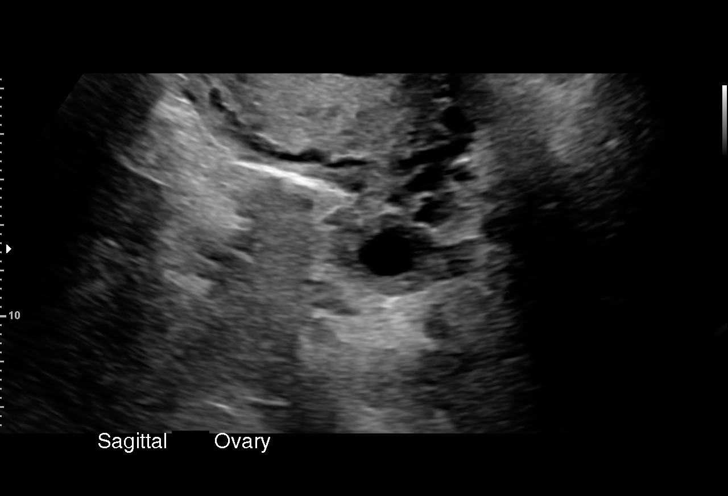
[im 22/28]
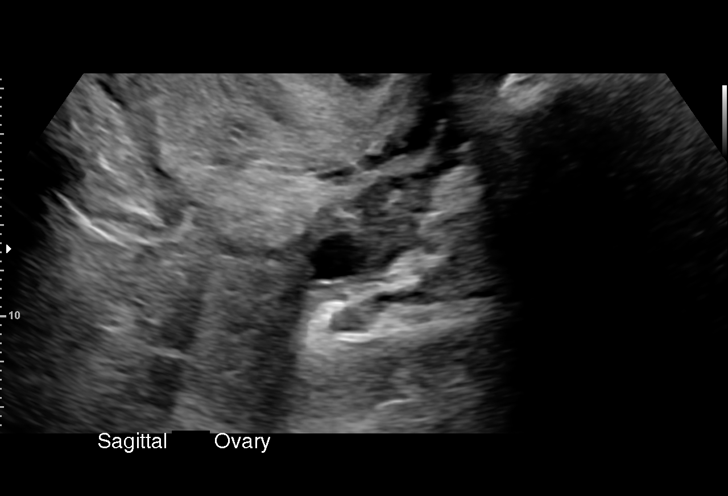
[im 24/28]
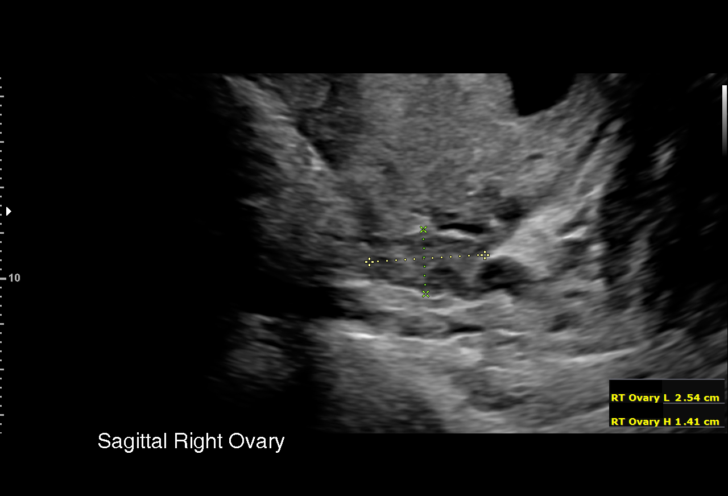
[im 26/28]
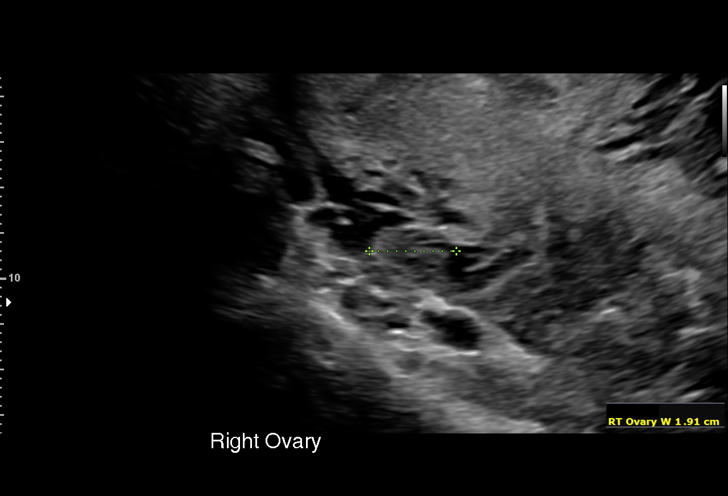
[im 28/28]
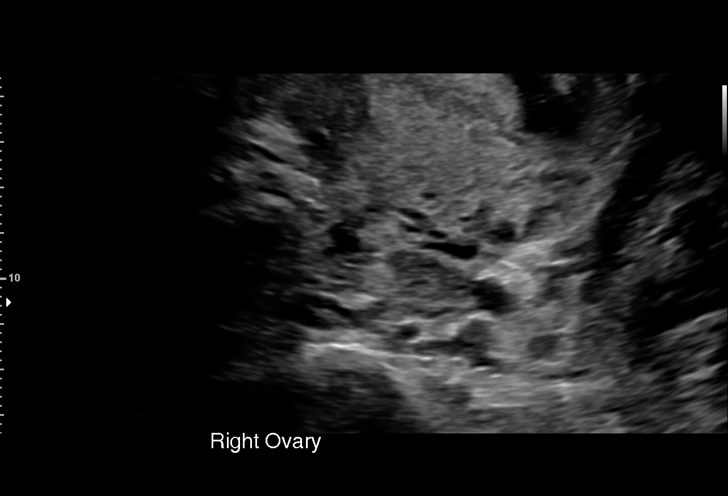

[15 of 28 positions shown; findings below may reference images not displayed]

FINDINGS: Intrauterine gestational sac: Single

Yolk sac:  Not seen

Embryo:  Visualized.

Cardiac Activity: Visualized.

Heart Rate: 153 bpm

CRL: 55.7 mm   12 w 1 d                  US EDC: 01/25/2019

Subchorionic hemorrhage:  None visualized.

Maternal uterus/adnexae: right ovary measures 2.5 x 1.4 x 1.9 cm.
Left ovary measures 2.8 x 2 x 2.1 cm. No significant free fluid.
IMPRESSION: Single viable intrauterine pregnancy as above. No specific
abnormality is seen

## 2020-07-12 IMAGING — US US MFM OB DETAIL +14 WK
1 series · 13 of 28 positions shown · non-contrast
Comparison: none

[Series 1: us mfm ob detail +14 wk · 13 of 100 slices shown]
[im 4/100]
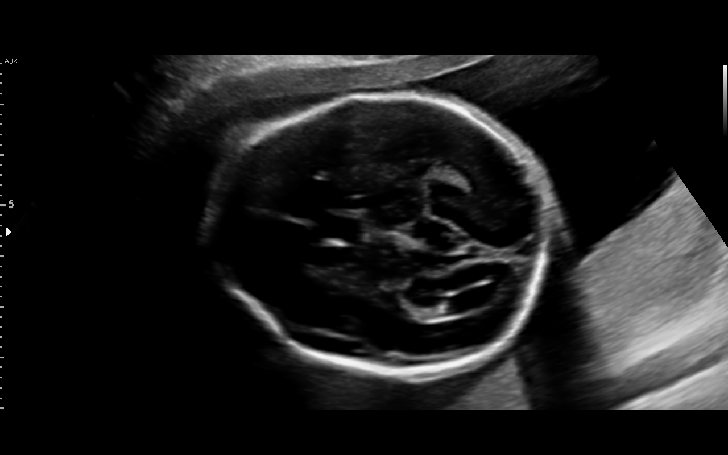
[im 12/100]
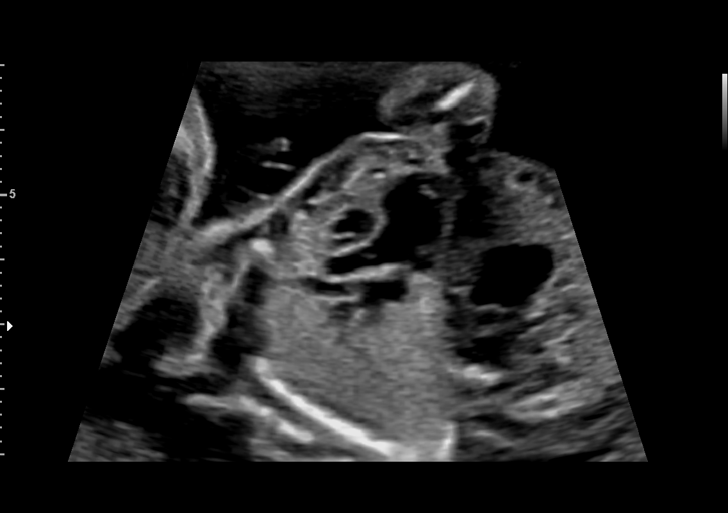
[im 19/100]
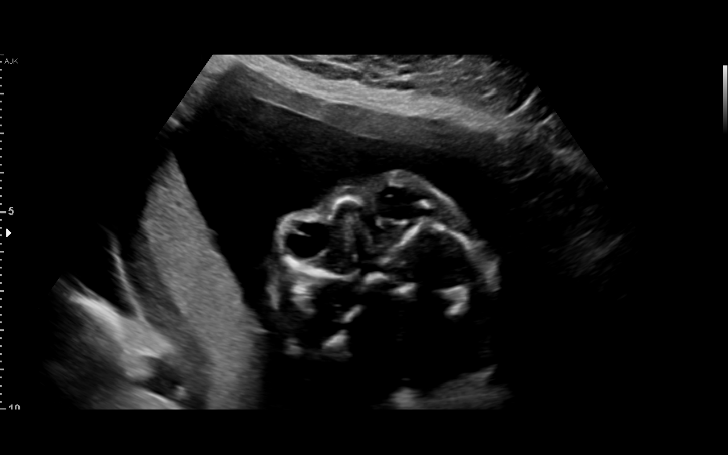
[im 26/100]
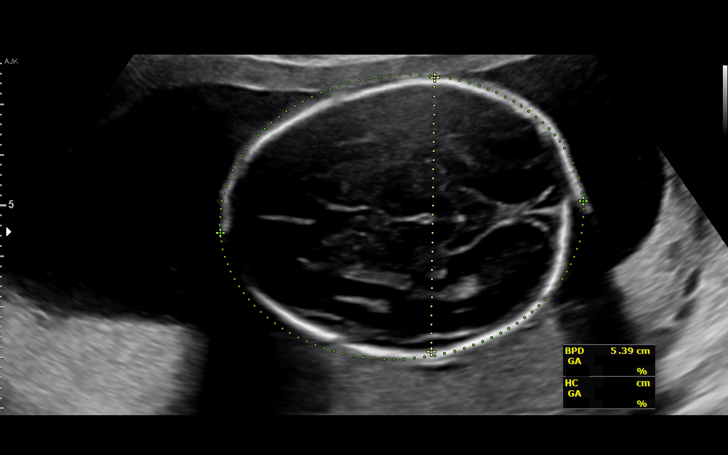
[im 34/100]
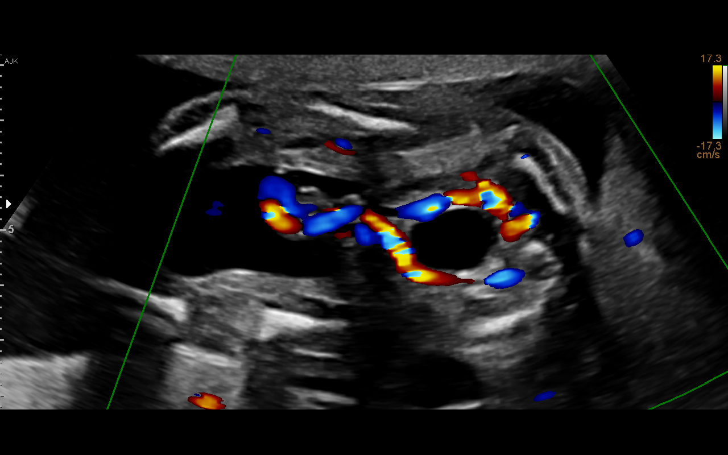
[im 41/100]
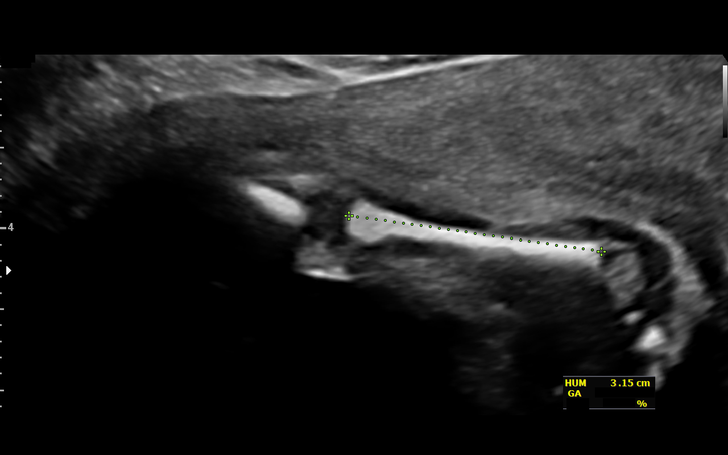
[im 52/100]
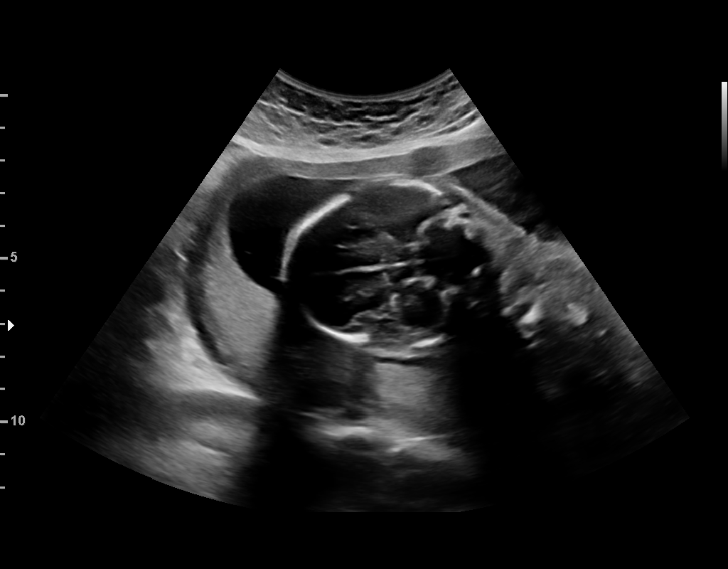
[im 59/100]
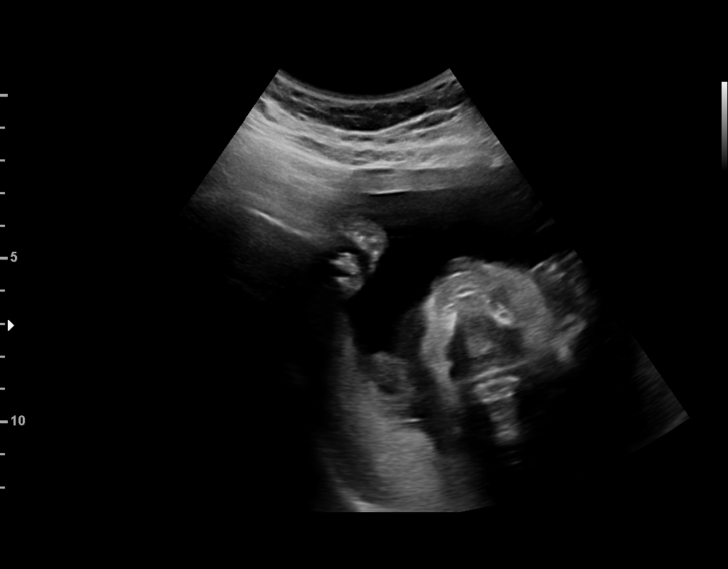
[im 67/100]
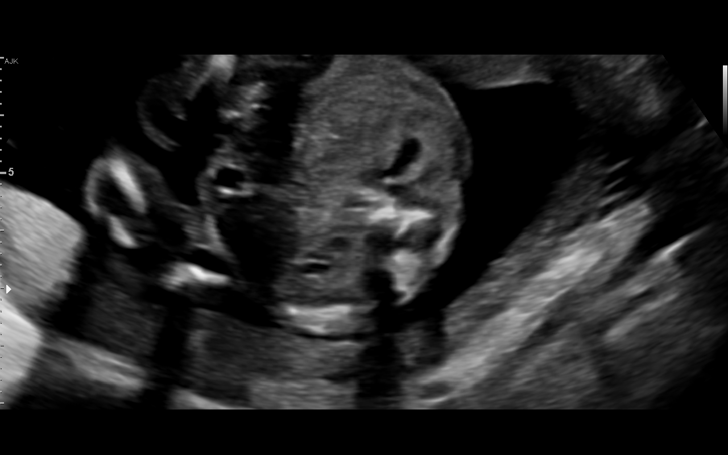
[im 74/100]
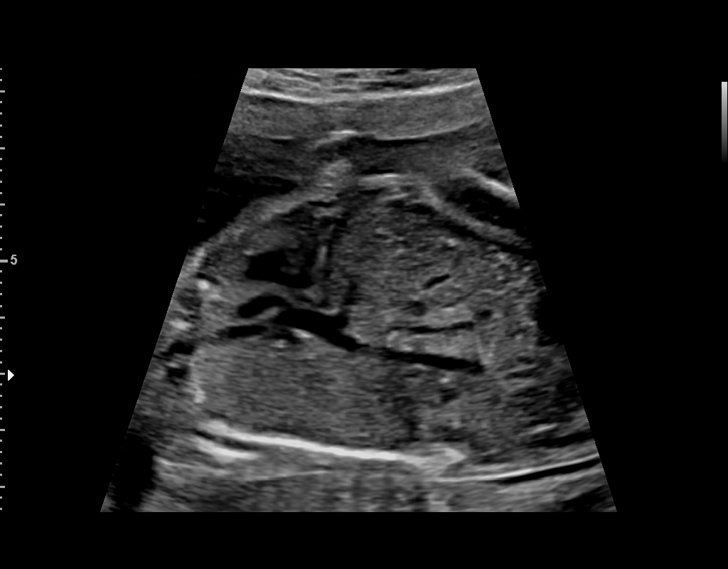
[im 81/100]
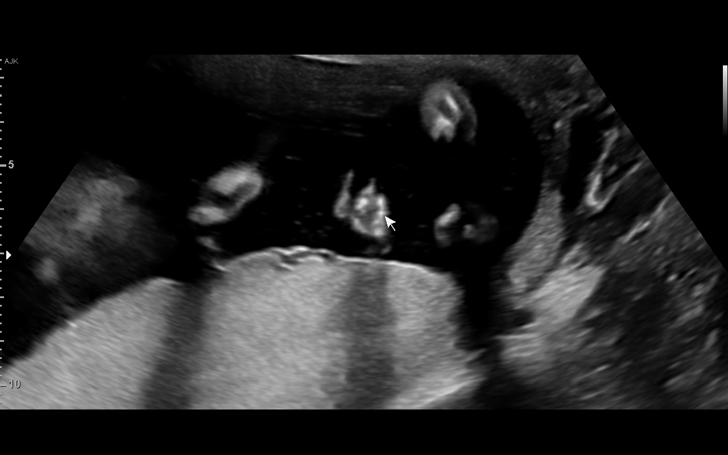
[im 89/100]
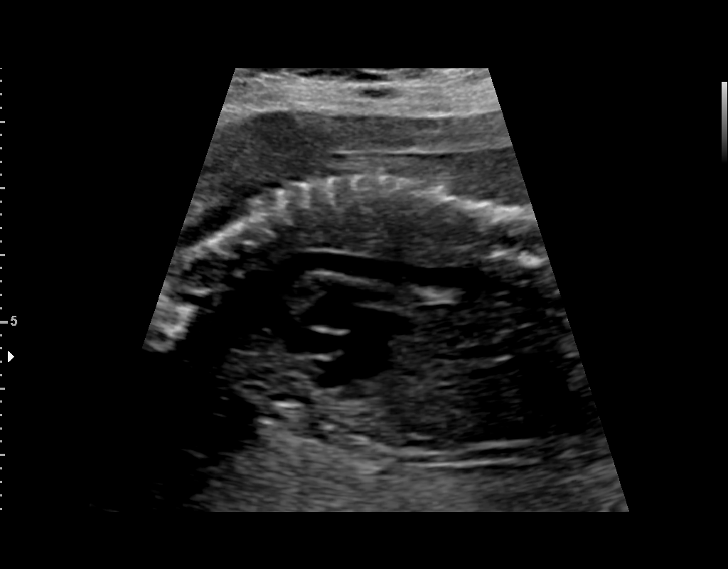
[im 96/100]
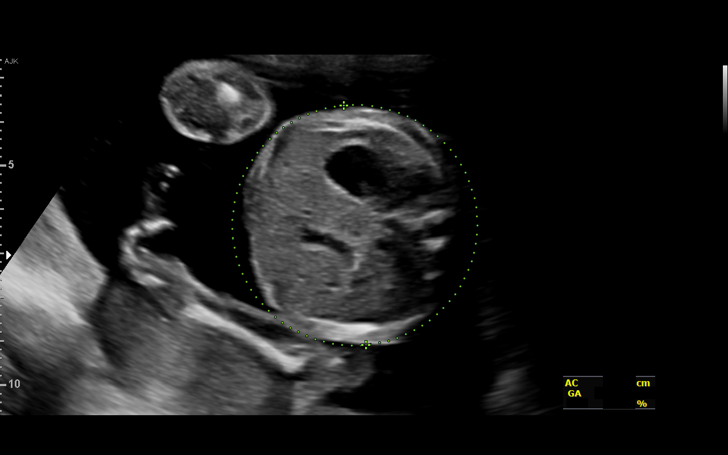

[13 of 28 positions shown; findings below may reference images not displayed]

----------------------------------------------------------------------

 ----------------------------------------------------------------------
Indications

  Encounter for antenatal screening for
  malformations
  Advanced maternal age multigravida 35+,
  second trimester (neg horizon)
  Poor obstetric history: Previous
  preeclampsia / eclampsia/gestational HTN
  (ASA)
  Poor obstetric history: Previous preterm
  delivery, antepartum (34-due to pre-e)
  21 weeks gestation of pregnancy
 ----------------------------------------------------------------------
Fetal Evaluation

 Num Of Fetuses:         1
 Fetal Heart Rate(bpm):  145
 Cardiac Activity:       Observed
 Presentation:           Variable
 Placenta:               Posterior
 P. Cord Insertion:      Visualized

 Amniotic Fluid
 AFI FV:      Within normal limits

                             Largest Pocket(cm)

Biometry

 BPD:      53.8  mm     G. Age:  22w 2d         78  %    CI:        74.46   %    70 - 86
                                                         FL/HC:      17.0   %    18.4 -
 HC:      197.9  mm     G. Age:  22w 0d         57  %    HC/AC:      1.14        1.06 -
 AC:      174.2  mm     G. Age:  22w 2d         68  %    FL/BPD:     62.6   %    71 - 87
 FL:       33.7  mm     G. Age:  20w 4d         13  %    FL/AC:      19.3   %    20 - 24
 HUM:      31.2  mm     G. Age:  20w 3d         17  %
 CER:        23  mm     G. Age:  21w 4d         47  %
 NFT:       6.9  mm

 LV:        3.9  mm
 CM:        3.4  mm

 Est. FW:     438  gm    0 lb 15 oz      46  %
OB History

 Gravidity:    4         Term:   2        Prem:   1
Gestational Age

 LMP:           21w 4d        Date:  04/24/18                 EDD:   01/29/19
 U/S Today:     21w 6d                                        EDD:   01/27/19
 Best:          21w 4d     Det. By:  LMP  (04/24/18)          EDD:   01/29/19
Anatomy

 Cranium:               Appears normal         Aortic Arch:            Appears normal
 Cavum:                 Appears normal         Ductal Arch:            Appears normal
 Ventricles:            Appears normal         Diaphragm:              Appears normal
 Choroid Plexus:        Appears normal         Stomach:                Appears normal, left
                                                                       sided
 Cerebellum:            Appears normal         Abdomen:                Appears normal
 Posterior Fossa:       Appears normal         Abdominal Wall:         Appears nml (cord
                                                                       insert, abd wall)
 Nuchal Fold:           Not applicable (>20    Cord Vessels:           Appears normal (3
                        wks GA)                                        vessel cord)
 Face:                  Appears normal         Kidneys:                Appear normal
                        (orbits and profile)
 Lips:                  Appears normal         Bladder:                Appears normal
 Thoracic:              Appears normal         Spine:                  Appears normal
 Heart:                 Appears normal         Upper Extremities:      Appears normal
                        (4CH, axis, and
                        situs)
 RVOT:                  Appears normal         Lower Extremities:      Appears normal
 LVOT:                  Appears normal

 Other:  Fetus appears to be a male. Heels and 5th digit visualized. Open
         hands visualized. Nasal bone visualized. Feet visualized.
Cervix Uterus Adnexa

 Cervix
 Length:           4.28  cm.
 Normal appearance by transabdominal scan.

 Uterus
 No abnormality visualized.

 Left Ovary
 Not visualized.

 Right Ovary
 Not visualized.

 Adnexa
 No abnormality visualized.
Impression

 We performed fetal anatomy scan. No makers of
 aneuploidies or fetal structural defects are seen. Fetal
 biometry is consistent with her previously-established dates.
 Amniotic fluid is normal and good fetal activity is seen.
 Cell-free fetal DNA screening results are pending.
Recommendations

 An appointmen was made for her to return at 32 weeks'
 gestation for fetal growth assessment (AMA).
                 Tsering, Fatou

## 2024-01-06 ENCOUNTER — Other Ambulatory Visit: Payer: Self-pay | Admitting: Obstetrics & Gynecology

## 2024-01-06 DIAGNOSIS — Z1231 Encounter for screening mammogram for malignant neoplasm of breast: Secondary | ICD-10-CM
